# Patient Record
Sex: Female | Born: 1935 | Race: White | Hispanic: No | Marital: Married | Smoking: Never smoker
Health system: Southern US, Community
[De-identification: ages and names within clinical notes are randomized; demographics above are authoritative.]

## PROBLEM LIST (undated history)

## (undated) DIAGNOSIS — I4891 Unspecified atrial fibrillation: Secondary | ICD-10-CM

## (undated) DIAGNOSIS — D039 Melanoma in situ, unspecified: Secondary | ICD-10-CM

## (undated) DIAGNOSIS — C439 Malignant melanoma of skin, unspecified: Secondary | ICD-10-CM

## (undated) DIAGNOSIS — C801 Malignant (primary) neoplasm, unspecified: Secondary | ICD-10-CM

## (undated) HISTORY — PX: VULVECTOMY: SHX1086

## (undated) HISTORY — PX: CHOLECYSTECTOMY: SHX55

## (undated) HISTORY — PX: MELANOMA EXCISION: SHX5266

## (undated) HISTORY — DX: Melanoma in situ, unspecified: D03.9

## (undated) HISTORY — DX: Malignant melanoma of skin, unspecified: C43.9

---

## 2016-03-30 DIAGNOSIS — C4499 Other specified malignant neoplasm of skin, unspecified: Secondary | ICD-10-CM | POA: Insufficient documentation

## 2017-06-05 DIAGNOSIS — K582 Mixed irritable bowel syndrome: Secondary | ICD-10-CM | POA: Insufficient documentation

## 2017-06-05 DIAGNOSIS — F5104 Psychophysiologic insomnia: Secondary | ICD-10-CM | POA: Insufficient documentation

## 2017-06-20 DIAGNOSIS — I2510 Atherosclerotic heart disease of native coronary artery without angina pectoris: Secondary | ICD-10-CM | POA: Insufficient documentation

## 2017-07-04 DIAGNOSIS — I071 Rheumatic tricuspid insufficiency: Secondary | ICD-10-CM | POA: Insufficient documentation

## 2018-07-16 ENCOUNTER — Other Ambulatory Visit: Payer: Self-pay | Admitting: Internal Medicine

## 2018-07-16 DIAGNOSIS — Z1231 Encounter for screening mammogram for malignant neoplasm of breast: Secondary | ICD-10-CM

## 2018-08-13 ENCOUNTER — Ambulatory Visit
Admission: RE | Admit: 2018-08-13 | Discharge: 2018-08-13 | Disposition: A | Discharge status: Home / Self Care | Payer: Medicare Other | Source: Ambulatory Visit | Attending: Internal Medicine | Admitting: Internal Medicine

## 2018-08-13 ENCOUNTER — Encounter (HOSPITAL_COMMUNITY): Payer: Self-pay

## 2018-08-13 DIAGNOSIS — Z1231 Encounter for screening mammogram for malignant neoplasm of breast: Secondary | ICD-10-CM | POA: Diagnosis present

## 2018-08-13 HISTORY — DX: Malignant (primary) neoplasm, unspecified: C80.1

## 2018-08-21 ENCOUNTER — Other Ambulatory Visit: Payer: Self-pay | Admitting: Internal Medicine

## 2018-08-21 DIAGNOSIS — R928 Other abnormal and inconclusive findings on diagnostic imaging of breast: Secondary | ICD-10-CM

## 2018-08-30 ENCOUNTER — Ambulatory Visit
Admission: RE | Admit: 2018-08-30 | Discharge: 2018-08-30 | Disposition: A | Discharge status: Home / Self Care | Payer: Medicare Other | Source: Ambulatory Visit | Attending: Internal Medicine | Admitting: Internal Medicine

## 2018-08-30 DIAGNOSIS — R928 Other abnormal and inconclusive findings on diagnostic imaging of breast: Secondary | ICD-10-CM | POA: Diagnosis present

## 2019-01-25 ENCOUNTER — Emergency Department: Payer: Medicare Other

## 2019-01-25 ENCOUNTER — Encounter: Payer: Self-pay | Admitting: Emergency Medicine

## 2019-01-25 ENCOUNTER — Emergency Department
Admission: EM | Admit: 2019-01-25 | Discharge: 2019-01-25 | Disposition: A | Discharge status: Home / Self Care | Payer: Medicare Other | Attending: Emergency Medicine | Admitting: Emergency Medicine

## 2019-01-25 ENCOUNTER — Other Ambulatory Visit: Payer: Self-pay

## 2019-01-25 DIAGNOSIS — R002 Palpitations: Secondary | ICD-10-CM | POA: Diagnosis present

## 2019-01-25 DIAGNOSIS — Z79899 Other long term (current) drug therapy: Secondary | ICD-10-CM | POA: Insufficient documentation

## 2019-01-25 DIAGNOSIS — R7309 Other abnormal glucose: Secondary | ICD-10-CM

## 2019-01-25 DIAGNOSIS — E860 Dehydration: Secondary | ICD-10-CM | POA: Diagnosis not present

## 2019-01-25 HISTORY — DX: Unspecified atrial fibrillation: I48.91

## 2019-01-25 LAB — CBC WITH DIFFERENTIAL/PLATELET
Abs Immature Granulocytes: 0.02 10*3/uL (ref 0.00–0.07)
Basophils Absolute: 0.1 10*3/uL (ref 0.0–0.1)
Basophils Relative: 1 %
Eosinophils Absolute: 0.2 10*3/uL (ref 0.0–0.5)
Eosinophils Relative: 4 %
HCT: 40 % (ref 36.0–46.0)
Hemoglobin: 13.4 g/dL (ref 12.0–15.0)
Immature Granulocytes: 0 %
Lymphocytes Relative: 22 %
Lymphs Abs: 1.2 10*3/uL (ref 0.7–4.0)
MCH: 30.4 pg (ref 26.0–34.0)
MCHC: 33.5 g/dL (ref 30.0–36.0)
MCV: 90.7 fL (ref 80.0–100.0)
Monocytes Absolute: 0.4 10*3/uL (ref 0.1–1.0)
Monocytes Relative: 7 %
Neutro Abs: 3.5 10*3/uL (ref 1.7–7.7)
Neutrophils Relative %: 66 %
Platelets: 232 10*3/uL (ref 150–400)
RBC: 4.41 MIL/uL (ref 3.87–5.11)
RDW: 13.2 % (ref 11.5–15.5)
WBC: 5.3 10*3/uL (ref 4.0–10.5)
nRBC: 0 % (ref 0.0–0.2)

## 2019-01-25 LAB — BASIC METABOLIC PANEL
Anion gap: 11 (ref 5–15)
BUN: 19 mg/dL (ref 8–23)
CO2: 21 mmol/L — ABNORMAL LOW (ref 22–32)
Calcium: 9 mg/dL (ref 8.9–10.3)
Chloride: 108 mmol/L (ref 98–111)
Creatinine, Ser: 0.92 mg/dL (ref 0.44–1.00)
GFR calc Af Amer: >60 mL/min (ref >60)
GFR calc non Af Amer: 58 mL/min — ABNORMAL LOW (ref >60)
Glucose, Bld: 187 mg/dL — ABNORMAL HIGH (ref 70–99)
Potassium: 3.8 mmol/L (ref 3.5–5.1)
Sodium: 140 mmol/L (ref 135–145)

## 2019-01-25 LAB — GLUCOSE, CAPILLARY: Glucose-Capillary: 93 mg/dL (ref 70–99)

## 2019-01-25 LAB — TROPONIN I: Troponin I: <0.03 ng/mL (ref <0.03)

## 2019-01-25 LAB — MAGNESIUM: Magnesium: 2.4 mg/dL (ref 1.7–2.4)

## 2019-01-25 MED ORDER — SODIUM CHLORIDE 0.9 % IV BOLUS
500.0000 mL | Freq: Once | INTRAVENOUS | Status: AC
  Administered 2019-01-25: 09:00:00 500 mL via INTRAVENOUS

## 2019-01-25 NOTE — Discharge Instructions (Signed)
As explained to you your sugar was slightly elevated here.  Since this is not a fasting result it is important they follow-up with your primary care doctor within the next week to have repeat labs done.  Follow-up with your cardiologist in 2 days.  Return to the emergency room for chest pain, shortness of breath, or recurrence of her palpitations.

## 2019-01-25 NOTE — ED Triage Notes (Signed)
Pt to ED via POV c/o palpitations since last night. Pt states that she called her cardiologist who recommended that she come in and be evaluated. Pt is in NAD.

## 2019-01-25 NOTE — ED Notes (Signed)
Patient is speaking on the phone with her husband.  Will continue to monitor.

## 2019-01-25 NOTE — ED Provider Notes (Addendum)
[facility] Emergency Department Provider Note  ____________________________________________  Time seen: Approximately 8:47 AM  I have reviewed the triage vital signs and the nursing notes.   HISTORY  Chief Complaint Palpitations   HPI Laurie Shields is a 85 y.o. female with a history of atrial fibrillation on Eliquis, melanoma, CAD, hyperlipidemia who presents for evaluation of palpitations.  Patient reports that she occasionally has palpitations in her chest but those are usually short-lived.  She reports that last night she was unable to sleep because the palpitations were ongoing throughout the night.  She denies chest pain or shortness of breath, cough, fever, vomiting, diarrhea, abdominal pain, dysuria or hematuria.  This morning the palpitations were ongoing which prompted her to call her primary care doctor.  The doctor instructed her to come to the hospital for evaluation.  Patient voices compliance with her Eliquis.  No personal family history of blood clots, no leg pain or swelling.  Past Medical History:  Diagnosis Date  . Atrial fibrillation ([place])   . Cancer ([place])    melanoma    There are no active problems to display for this patient.   History reviewed. No pertinent surgical history.  Prior to Admission medications   Medication Sig Start Date End Date Taking? Authorizing Provider  Cholecalciferol (VITAMIN D3) 50 MCG (2000 UT) capsule Take 2,000 Units by mouth daily.   Yes [provider]  co-enzyme Q-10 30 MG capsule Take 30 mg by mouth daily.    Yes [provider]  diltiazem (DILT-XR) 240 MG 24 hr capsule TAKE 1 CAPSULE BY MOUTH  ONCE DAILY 01/21/19  Yes [provider]  ELIQUIS 2.5 MG TABS tablet Take 2.5 mg by mouth daily. 11/20/18  Yes [provider]  Multiple Vitamin (MULTIVITAMIN) tablet Take 1 tablet by mouth daily.   Yes [provider]  potassium chloride SA (KLOR-CON M20) 20 MEQ tablet  Take 20 mEq by mouth 2 (two) times daily. 05/23/17  Yes [provider]    Allergies Trazodone  Family History  Problem Relation Age of Onset  . Breast cancer Neg Hx     Social History Social History   Tobacco Use  . Smoking status: Never Smoker  . Smokeless tobacco: Never Used  Substance Use Topics  . Alcohol use: Not Currently  . Drug use: Not Currently    Review of Systems  Constitutional: Negative for fever. Eyes: Negative for visual changes. ENT: Negative for sore throat. Neck: No neck pain  Cardiovascular: Negative for chest pain. + palpitations Respiratory: Negative for shortness of breath. Gastrointestinal: Negative for abdominal pain, vomiting or diarrhea. Genitourinary: Negative for dysuria. Musculoskeletal: Negative for back pain. Skin: Negative for rash. Neurological: Negative for headaches, weakness or numbness. Psych: No SI or HI  ____________________________________________   PHYSICAL EXAM:  VITAL SIGNS: ED Triage Vitals  Enc Vitals Group     BP 01/25/19 0823 109/66     Pulse Rate 01/25/19 0823 99     Resp 01/25/19 0823 16     Temp 01/25/19 0823 (!) 97.5 F (36.4 C)     Temp Source 01/25/19 0823 Oral     SpO2 01/25/19 0823 97 %     Weight 01/25/19 0819 125 lb (56.7 kg)     Height 01/25/19 0819 5\' 4"  (1.626 m)     Head Circumference --      Peak Flow --      Pain Score 01/25/19 0819 0     Pain Loc --  Pain Edu? --      Excl. in [place]? --     Constitutional: Alert and oriented. Well appearing and in no apparent distress. HEENT:      Head: Normocephalic and atraumatic.         Eyes: Conjunctivae are normal. Sclera is non-icteric.       Mouth/Throat: Mucous membranes are moist.       Neck: Supple with no signs of meningismus. Cardiovascular: Regular rate and rhythm. No murmurs, gallops, or rubs. 2+ symmetrical distal pulses are present in all extremities. No JVD. Respiratory: Normal respiratory effort. Lungs are clear to  auscultation bilaterally. No wheezes, crackles, or rhonchi.  Gastrointestinal: Soft, non tender, and non distended with positive bowel sounds. No rebound or guarding. Musculoskeletal: Nontender with normal range of motion in all extremities. No edema, cyanosis, or erythema of extremities. Neurologic: Normal speech and language. Face is symmetric. Moving all extremities. No gross focal neurologic deficits are appreciated. Skin: Skin is warm, dry and intact. No rash noted. Psychiatric: Mood and affect are normal. Speech and behavior are normal.  ____________________________________________   LABS (all labs ordered are listed, but only abnormal results are displayed)  Labs Reviewed  BASIC METABOLIC PANEL - Abnormal; Notable for the following components:      Result Value   CO2 21 (*)    Glucose, Bld 187 (*)    GFR calc non Af Amer 58 (*)    All other components within normal limits  CBC WITH DIFFERENTIAL/PLATELET  MAGNESIUM  TROPONIN I   ____________________________________________  EKG  ED ECG REPORT I, Rudene Re, the attending physician, personally viewed and interpreted this ECG.  Sinus tachycardia, rate of 103, normal intervals, normal axis, low voltage QRS, anterior and inferior Q waves, no ST elevations or depressions.  No prior for comparison. ____________________________________________  RADIOLOGY  I have personally reviewed the images performed during this visit and I agree with the Radiologist's read.   Interpretation by Radiologist:  Dg Chest 2 View  Result Date: 01/25/2019 CLINICAL DATA:  Patient reports onset of rapid heart rate last night. Reports associated SOB that is worse when recumbent. Patient reports symptoms have improved since last night but not totally resolved. Denies CP or fever. Hx fever, afib, EXAM: CHEST - 2 VIEW COMPARISON:  none FINDINGS: Lungs are clear, hyperinflated. Heart size upper limits normal. Aortic Atherosclerosis (ICD10-170.0).  No effusion.  No pneumothorax. Fixation hardware in the proximal left humerus partially visualized. Surgical clips in the upper abdomen. IMPRESSION: No acute cardiopulmonary disease. Electronically Signed   By: Lucrezia Europe M.D.   On: 01/25/2019 09:26      ____________________________________________   PROCEDURES  Procedure(s) performed: None Procedures Critical Care performed:  None ____________________________________________   INITIAL IMPRESSION / ASSESSMENT AND PLAN / ED COURSE  85 y.o. female with a history of atrial fibrillation on Eliquis, melanoma, CAD, hyperlipidemia who presents for evaluation of palpitations. Ddx A. fib versus other possible dysrhythmia such as SVT versus V. tach.  At this time patient is in normal sinus rhythm and continues to endorse palpitations.  EKG showing no evidence of dysrhythmia or ischemia.  Will check electrolytes, will check for dehydration, will check for anemia, will check for cardiac ischemia.  Will monitor on telemetry.  Patient looks slightly dry will give IV fluids.    _________________________ 11:30 AM on 01/25/2019 ----------------------------------------- Labs showing no leukocytosis, no anemia, baseline creatinine, normal electrolytes.  Sugar slightly elevated at 187 however patient reports drinking an Ensure before coming in.  Recommended follow-up with PCP for recheck fasting blood glucose.  Troponin negative.  Chest x-ray with no acute findings.  Patient was monitored on telemetry for 3 hours with no arrhythmias seen.  After 500 cc bolus patient felt markedly improved with resolution of her palpitations.  Patient ambulated with no difficulty and had no chest pain, dizziness, shortness of breath with ambulation.  Recommended follow-up with cardiologist.  Discussed my standard return precautions.    As part of my medical decision making, I reviewed the following data within the [place] notes reviewed and  incorporated, Labs reviewed , EKG interpreted , Old chart reviewed, Radiograph reviewed , Notes from prior ED visits and [place] Controlled Substance Database    Pertinent labs & imaging results that were available during my care of the patient were reviewed by me and considered in my medical decision making (see chart for details).    ____________________________________________   FINAL CLINICAL IMPRESSION(S) / ED DIAGNOSES  Final diagnoses:  Palpitations  Dehydration  Elevated random blood glucose level      NEW MEDICATIONS STARTED DURING THIS VISIT:  ED Discharge Orders    None       Note:  This document was prepared using Dragon voice recognition software and may include unintentional dictation errors.    Alfred Levins, [place], MD 01/25/19 [place], [place], [place] 01/25/19 [phone]

## 2019-01-25 NOTE — ED Notes (Signed)
Returned from XR 

## 2019-02-12 DIAGNOSIS — I10 Essential (primary) hypertension: Secondary | ICD-10-CM | POA: Insufficient documentation

## 2019-03-11 DIAGNOSIS — C4492 Squamous cell carcinoma of skin, unspecified: Secondary | ICD-10-CM

## 2019-03-11 HISTORY — DX: Squamous cell carcinoma of skin, unspecified: C44.92

## 2019-06-29 ENCOUNTER — Emergency Department
Admission: EM | Admit: 2019-06-29 | Discharge: 2019-06-29 | Disposition: A | Discharge status: Home / Self Care | Payer: Medicare Other | Attending: Emergency Medicine | Admitting: Emergency Medicine

## 2019-06-29 ENCOUNTER — Emergency Department: Payer: Medicare Other

## 2019-06-29 ENCOUNTER — Other Ambulatory Visit: Payer: Self-pay

## 2019-06-29 DIAGNOSIS — I4891 Unspecified atrial fibrillation: Secondary | ICD-10-CM | POA: Diagnosis not present

## 2019-06-29 DIAGNOSIS — R109 Unspecified abdominal pain: Secondary | ICD-10-CM

## 2019-06-29 DIAGNOSIS — Z7901 Long term (current) use of anticoagulants: Secondary | ICD-10-CM | POA: Insufficient documentation

## 2019-06-29 DIAGNOSIS — R1031 Right lower quadrant pain: Secondary | ICD-10-CM | POA: Diagnosis not present

## 2019-06-29 LAB — CBC WITH DIFFERENTIAL/PLATELET
Abs Immature Granulocytes: 0.03 10*3/uL (ref 0.00–0.07)
Basophils Absolute: 0.1 10*3/uL (ref 0.0–0.1)
Basophils Relative: 1 %
Eosinophils Absolute: 0.2 10*3/uL (ref 0.0–0.5)
Eosinophils Relative: 3 %
HCT: 38.4 % (ref 36.0–46.0)
Hemoglobin: 12.7 g/dL (ref 12.0–15.0)
Immature Granulocytes: 0 %
Lymphocytes Relative: 27 %
Lymphs Abs: 1.8 10*3/uL (ref 0.7–4.0)
MCH: 30.5 pg (ref 26.0–34.0)
MCHC: 33.1 g/dL (ref 30.0–36.0)
MCV: 92.3 fL (ref 80.0–100.0)
Monocytes Absolute: 0.7 10*3/uL (ref 0.1–1.0)
Monocytes Relative: 11 %
Neutro Abs: 3.9 10*3/uL (ref 1.7–7.7)
Neutrophils Relative %: 58 %
Platelets: 200 10*3/uL (ref 150–400)
RBC: 4.16 MIL/uL (ref 3.87–5.11)
RDW: 13.1 % (ref 11.5–15.5)
WBC: 6.7 10*3/uL (ref 4.0–10.5)
nRBC: 0 % (ref 0.0–0.2)

## 2019-06-29 LAB — COMPREHENSIVE METABOLIC PANEL
ALT: 38 U/L (ref 0–44)
AST: 31 U/L (ref 15–41)
Albumin: 4.4 g/dL (ref 3.5–5.0)
Alkaline Phosphatase: 103 U/L (ref 38–126)
Anion gap: 13 (ref 5–15)
BUN: 21 mg/dL (ref 8–23)
CO2: 23 mmol/L (ref 22–32)
Calcium: 9.6 mg/dL (ref 8.9–10.3)
Chloride: 100 mmol/L (ref 98–111)
Creatinine, Ser: 0.84 mg/dL (ref 0.44–1.00)
GFR calc Af Amer: >60 mL/min (ref >60)
GFR calc non Af Amer: >60 mL/min (ref >60)
Glucose, Bld: 108 mg/dL — ABNORMAL HIGH (ref 70–99)
Potassium: 3.8 mmol/L (ref 3.5–5.1)
Sodium: 136 mmol/L (ref 135–145)
Total Bilirubin: 0.7 mg/dL (ref 0.3–1.2)
Total Protein: 7.2 g/dL (ref 6.5–8.1)

## 2019-06-29 LAB — URINALYSIS, ROUTINE W REFLEX MICROSCOPIC
Bilirubin Urine: NEGATIVE
Glucose, UA: NEGATIVE mg/dL
Hgb urine dipstick: NEGATIVE
Ketones, ur: NEGATIVE mg/dL
Nitrite: NEGATIVE
Protein, ur: NEGATIVE mg/dL
Specific Gravity, Urine: 1.002 — ABNORMAL LOW (ref 1.005–1.030)
Squamous Epithelial / HPF: NONE SEEN (ref 0–5)
pH: 7 (ref 5.0–8.0)

## 2019-06-29 NOTE — ED Notes (Signed)
Report received from [place], [place]. Pt is laying in bed comfortably with husband at bedside. Pt in NAD at this time. Pt has no complaints at this time. Delay explained to pt. ED stretcher locked and in lowest position. Pt given warm blankets. Will continue to monitor.

## 2019-06-29 NOTE — Discharge Instructions (Signed)
Your work-up was reassuring.  You should return to the ER for vomiting, fevers, pain your chest or any other concerns.  Negative for urinary tract calculi.   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

## 2019-06-29 NOTE — ED Notes (Signed)
Pt's spouse out to desk to ask how much longer. Spouse informed that pt's are taken to CT by acuity and in order. Spouse and pt verbalized understanding to this RN.

## 2019-06-29 NOTE — ED Provider Notes (Signed)
[facility] Emergency Department Provider Note  Time seen: 4:32 AM  I have reviewed the triage vital signs and the nursing notes.   HISTORY  Chief Complaint Flank Pain   HPI Laurie Shields is a 85 y.o. female with a past medical history of atrial fibrillation presents to the emergency department for right flank pain.  According to the patient beginning earlier today she developed acute onset of right flank pain initially started in her right mid back and then radiated around to her right flank.  States the pain worsened around 10 or 11 PM they came to the emergency department around midnight.  However since waiting here for several hours the pain has dissipated and she describes the pain is very minimal currently.  Denies any vomiting or diarrhea.  Denies any dysuria or hematuria.  No history of kidney stones.  Denies any fever cough or shortness of breath.  Past Medical History:  Diagnosis Date  . Atrial fibrillation ([place])   . Cancer ([place])    melanoma    There are no active problems to display for this patient.   No past surgical history on file.  Prior to Admission medications   Medication Sig Start Date End Date Taking? Authorizing Provider  Cholecalciferol (VITAMIN D3) 50 MCG (2000 UT) capsule Take 2,000 Units by mouth daily.    [provider]  co-enzyme Q-10 30 MG capsule Take 30 mg by mouth daily.     [provider]  diltiazem (DILT-XR) 240 MG 24 hr capsule TAKE 1 CAPSULE BY MOUTH  ONCE DAILY 01/21/19   [provider]  ELIQUIS 2.5 MG TABS tablet Take 2.5 mg by mouth daily. 11/20/18   [provider]  Multiple Vitamin (MULTIVITAMIN) tablet Take 1 tablet by mouth daily.    [provider]  potassium chloride SA (KLOR-CON M20) 20 MEQ tablet Take 20 mEq by mouth 2 (two) times daily. 05/23/17   [provider]    Allergies  Allergen Reactions  . Trazodone Other (See Comments) and Nausea Only    Blurred  vision    Family History  Problem Relation Age of Onset  . Breast cancer Neg Hx     Social History Social History   Tobacco Use  . Smoking status: Never Smoker  . Smokeless tobacco: Never Used  Substance Use Topics  . Alcohol use: Not Currently  . Drug use: Not Currently    Review of Systems Constitutional: Negative for fever. Cardiovascular: Negative for chest pain. Respiratory: Negative for shortness of breath. Gastrointestinal: Right flank pain. Genitourinary: Negative for urinary compaints Musculoskeletal: Negative for musculoskeletal complaints Skin: Negative for skin complaints  Neurological: Negative for headache All other ROS negative  ____________________________________________   PHYSICAL EXAM:  VITAL SIGNS: ED Triage Vitals  Enc Vitals Group     BP 06/29/19 0108 (!) 153/98     Pulse Rate 06/29/19 0108 74     Resp 06/29/19 0108 18     Temp 06/29/19 0108 97.7 F (36.5 C)     Temp Source 06/29/19 0108 Oral     SpO2 06/29/19 0108 97 %     Weight 06/29/19 0109 123 lb (55.8 kg)     Height 06/29/19 0109 5\' 3"  (1.6 m)     Head Circumference --      Peak Flow --      Pain Score 06/29/19 0109 8     Pain Loc --      Pain Edu? --  Excl. in [place]? --    Constitutional: Alert and oriented. Well appearing and in no distress. Eyes: Normal exam ENT      Head: Normocephalic and atraumatic.      Mouth/Throat: Mucous membranes are moist. Cardiovascular: Normal rate, regular rhythm.  Respiratory: Normal respiratory effort without tachypnea nor retractions. Breath sounds are clear Gastrointestinal: Soft and nontender. No distention.  There is no CVA tenderness. Musculoskeletal: Nontender with normal range of motion in all extremities Neurologic:  Normal speech and language. No gross focal neurologic deficits  Skin:  Skin is warm, dry and intact.  Psychiatric: Mood and affect are normal.  ____________________________________________    RADIOLOGY  CT  pending  ____________________________________________   INITIAL IMPRESSION / ASSESSMENT AND PLAN / ED COURSE  Pertinent labs & imaging results that were available during my care of the patient were reviewed by me and considered in my medical decision making (see chart for details).   Patient presents emergency department for right flank pain starting earlier today.  Pain has since dissipated considerably.  Patient's labs are largely within normal limits.  Differential would still include ureterolithiasis less likely appendicitis given benign abdominal exam.  Urinalysis does show rare bacteria but no white cells or red cells does not appear consistent with UTI we will add a urine culture as a precaution.  Patient is status post cholecystectomy many years ago per patient.  We will proceed with a CT renal scan to further evaluate.  CT scan pending.  UA pending.  Patient care signed out to oncoming physician.  Laurie Shields was evaluated in Emergency Department on 06/29/2019 for the symptoms described in the history of present illness. She was evaluated in the context of the global COVID-19 pandemic, which necessitated consideration that the patient might be at risk for infection with the SARS-CoV-2 virus that causes COVID-19. Institutional protocols and algorithms that pertain to the evaluation of patients at risk for COVID-19 are in a state of rapid change based on information released by regulatory bodies including the CDC and federal and state organizations. These policies and algorithms were followed during the patient's care in the ED.  ____________________________________________   FINAL CLINICAL IMPRESSION(S) / ED DIAGNOSES  Right flank pain   Harvest Dark, MD 07/03/19 2235

## 2019-06-29 NOTE — ED Triage Notes (Signed)
Patient reports pain to right flank area since early Saturday evening.  Denies urinary symptoms.

## 2019-06-29 NOTE — ED Notes (Signed)
Pt up to the BR with no assistance needed.

## 2019-06-29 NOTE — ED Provider Notes (Signed)
7:51 AM Assumed care for off going team.   Blood pressure (!) 154/85, pulse 65, temperature 97.7 F (36.5 C), temperature source Oral, resp. rate 17, height 5\' 3"  (1.6 m), weight 55.8 kg, SpO2 98 %.  See their HPI for full report but in brief R flank pain no prior kidney stone history. Resolved pain now. Labs normal. CT renal.   Labs reviewed and are reassuring.  No evidence of UTI.  CT scan without evidence of kidney stone, appendicitis.  Patient does have diverticulosis without signs of diverticulitis.  Reevaluated patient.  Pain is completely resolved at this time.  Patient's initial pain was in the right flank region.  She denies any chest pain.  I discussed getting an EKG to make sure that her heart looked okay but patient declined stating that her pain is now resolved and that she would just prefer to go home.  Given she is had no pain in her chest or shortness of breath with this is reasonable.  Patient understands that if she develops worsening or new pain that she should return to the ER for further work-up.   I discussed the provisional nature of ED diagnosis, the treatment so far, the ongoing plan of care, follow up appointments and return precautions with the patient and any family or support people present. They expressed understanding and agreed with the plan, discharged home.           Vanessa Golden, MD 06/29/19 [phone]

## 2019-06-30 LAB — URINE CULTURE: Culture: <10000 — AB

## 2019-07-22 DIAGNOSIS — G4733 Obstructive sleep apnea (adult) (pediatric): Secondary | ICD-10-CM

## 2019-07-27 IMAGING — US ULTRASOUND LEFT BREAST LIMITED
1 series · 2 of 2 positions shown · non-contrast
Comparison: Previous exam(s).

CLINICAL DATA: Possible left breast distortion seen on most recent
screening mammography.

EXAM:
DIGITAL DIAGNOSTIC LEFT MAMMOGRAM WITH CAD AND TOMO
ULTRASOUND LEFT BREAST

[Series 1: ultrasound left breast limited · 0.05mm/px · 2 of 2 slices shown]
[im 1/2]
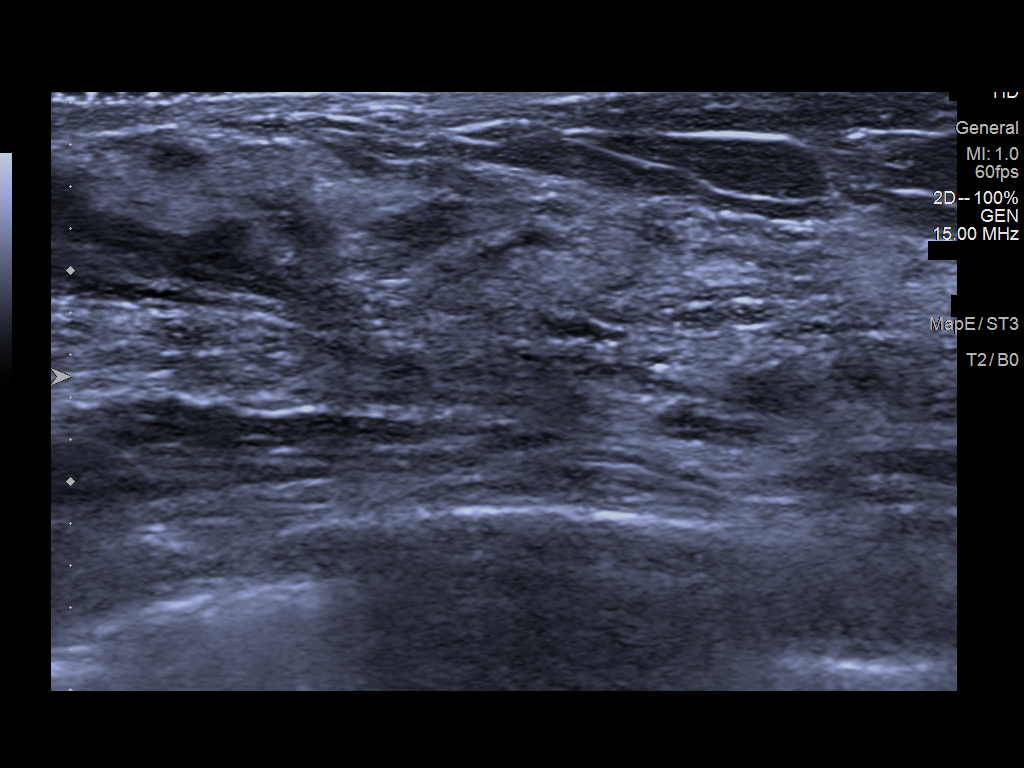
[im 2/2]
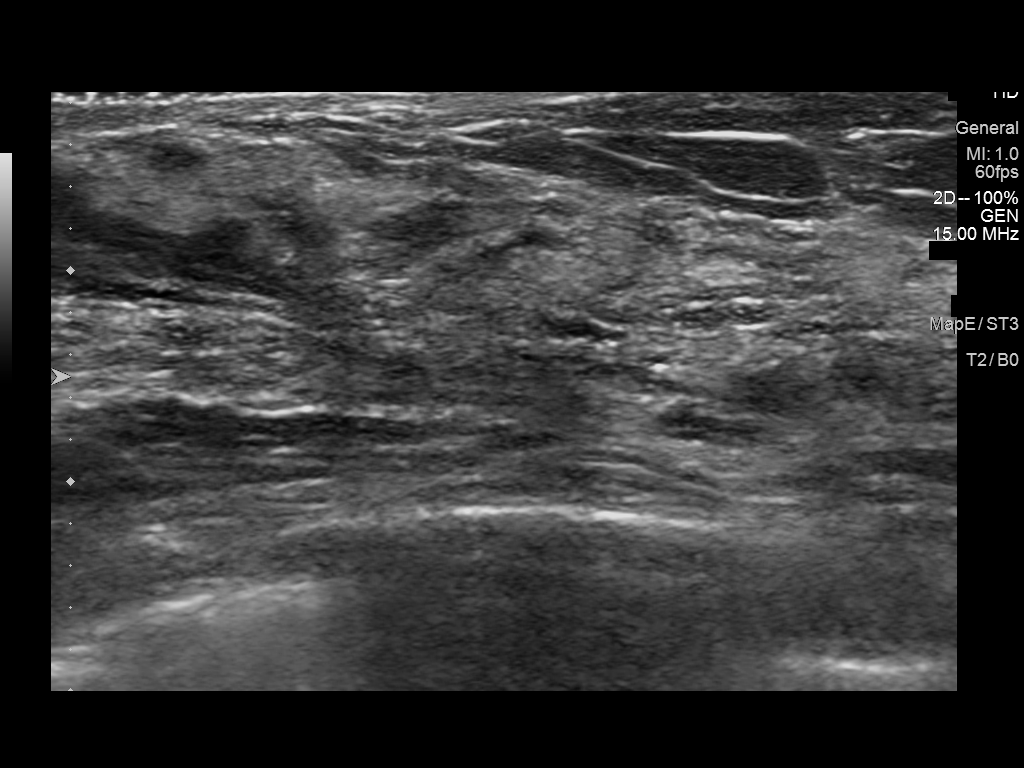

[2 of 2 positions shown; findings below may reference images not displayed]

ACR Breast Density Category c: The breast tissue is heterogeneously
dense, which may obscure small masses.
FINDINGS: Additional mammographic views of the left breast demonstrate no
suspicious masses or areas of architectural distortion. The
previously questioned distortion in the slightly lateral left
breast, anterior depth, effaces to glandular tissue.

Mammographic images were processed with CAD.

On physical exam, no suspicious masses are palpated.

Confirmatory targeted left breast ultrasound is performed, showing
no suspicious masses or shadowing lesions.
IMPRESSION: No mammographic or sonographic evidence of malignancy in the left
breast.

RECOMMENDATION:
Screening mammogram in one year.(Code:[id number])

I have discussed the findings and recommendations with the patient.
Results were also provided in writing at the conclusion of the
visit. If applicable, a reminder letter will be sent to the patient
regarding the next appointment.

BI-RADS CATEGORY  2: Benign.

## 2019-07-28 ENCOUNTER — Encounter: Payer: Self-pay | Admitting: *Deleted

## 2019-07-28 ENCOUNTER — Ambulatory Visit
Admission: RE | Admit: 2019-07-28 | Discharge: 2019-07-28 | Disposition: A | Discharge status: Home / Self Care | Payer: Medicare Other | Source: Ambulatory Visit | Attending: Infectious Diseases | Admitting: Infectious Diseases

## 2019-07-28 ENCOUNTER — Other Ambulatory Visit: Payer: Self-pay | Admitting: Infectious Diseases

## 2019-07-28 ENCOUNTER — Other Ambulatory Visit: Payer: Self-pay

## 2019-07-28 ENCOUNTER — Emergency Department: Payer: Medicare Other

## 2019-07-28 ENCOUNTER — Emergency Department
Admission: EM | Admit: 2019-07-28 | Discharge: 2019-07-28 | Disposition: A | Discharge status: Home / Self Care | Payer: Medicare Other | Attending: Emergency Medicine | Admitting: Emergency Medicine

## 2019-07-28 DIAGNOSIS — I480 Paroxysmal atrial fibrillation: Secondary | ICD-10-CM

## 2019-07-28 DIAGNOSIS — R5383 Other fatigue: Secondary | ICD-10-CM

## 2019-07-28 DIAGNOSIS — R42 Dizziness and giddiness: Secondary | ICD-10-CM

## 2019-07-28 DIAGNOSIS — Z85820 Personal history of malignant melanoma of skin: Secondary | ICD-10-CM | POA: Insufficient documentation

## 2019-07-28 DIAGNOSIS — R531 Weakness: Secondary | ICD-10-CM | POA: Diagnosis present

## 2019-07-28 LAB — DIFFERENTIAL
Abs Immature Granulocytes: 0.02 10*3/uL (ref 0.00–0.07)
Basophils Absolute: 0.1 10*3/uL (ref 0.0–0.1)
Basophils Relative: 1 %
Eosinophils Absolute: 0.2 10*3/uL (ref 0.0–0.5)
Eosinophils Relative: 2 %
Immature Granulocytes: 0 %
Lymphocytes Relative: 25 %
Lymphs Abs: 1.8 10*3/uL (ref 0.7–4.0)
Monocytes Absolute: 0.6 10*3/uL (ref 0.1–1.0)
Monocytes Relative: 8 %
Neutro Abs: 4.6 10*3/uL (ref 1.7–7.7)
Neutrophils Relative %: 64 %

## 2019-07-28 LAB — COMPREHENSIVE METABOLIC PANEL
ALT: 22 U/L (ref 0–44)
AST: 23 U/L (ref 15–41)
Albumin: 4.5 g/dL (ref 3.5–5.0)
Alkaline Phosphatase: 95 U/L (ref 38–126)
Anion gap: 12 (ref 5–15)
BUN: 24 mg/dL — ABNORMAL HIGH (ref 8–23)
CO2: 23 mmol/L (ref 22–32)
Calcium: 9.9 mg/dL (ref 8.9–10.3)
Chloride: 104 mmol/L (ref 98–111)
Creatinine, Ser: 1.02 mg/dL — ABNORMAL HIGH (ref 0.44–1.00)
GFR calc Af Amer: 58 mL/min — ABNORMAL LOW (ref >60)
GFR calc non Af Amer: 50 mL/min — ABNORMAL LOW (ref >60)
Glucose, Bld: 93 mg/dL (ref 70–99)
Potassium: 3.9 mmol/L (ref 3.5–5.1)
Sodium: 139 mmol/L (ref 135–145)
Total Bilirubin: 0.8 mg/dL (ref 0.3–1.2)
Total Protein: 7.4 g/dL (ref 6.5–8.1)

## 2019-07-28 LAB — CBC
HCT: 39.5 % (ref 36.0–46.0)
Hemoglobin: 13.8 g/dL (ref 12.0–15.0)
MCH: 31.3 pg (ref 26.0–34.0)
MCHC: 34.9 g/dL (ref 30.0–36.0)
MCV: 89.6 fL (ref 80.0–100.0)
Platelets: 246 10*3/uL (ref 150–400)
RBC: 4.41 MIL/uL (ref 3.87–5.11)
RDW: 12.9 % (ref 11.5–15.5)
WBC: 7.2 10*3/uL (ref 4.0–10.5)
nRBC: 0 % (ref 0.0–0.2)

## 2019-07-28 LAB — APTT: aPTT: 35 seconds (ref 24–36)

## 2019-07-28 LAB — PROTIME-INR
INR: 1 (ref 0.8–1.2)
Prothrombin Time: 12.9 seconds (ref 11.4–15.2)

## 2019-07-28 LAB — TROPONIN I (HIGH SENSITIVITY): Troponin I (High Sensitivity): 5 ng/L (ref <18)

## 2019-07-28 MED ORDER — SODIUM CHLORIDE 0.9 % IV BOLUS
1000.0000 mL | Freq: Once | INTRAVENOUS | Status: AC
  Administered 2019-07-28: 21:00:00 1000 mL via INTRAVENOUS

## 2019-07-28 MED ORDER — IOHEXOL 350 MG/ML SOLN
75.0000 mL | Freq: Once | INTRAVENOUS | Status: AC | PRN
  Administered 2019-07-28: 75 mL via INTRAVENOUS

## 2019-07-28 NOTE — ED Provider Notes (Signed)
[facility] Emergency Department Provider Note  ____________________________________________   First MD Initiated Contact with Patient 07/28/19 1950     (approximate)  I have reviewed the triage vital signs and the nursing notes.   HISTORY  Chief Complaint Transient Ischemic Attack    HPI Laurie Shields is a 85 y.o. female  Here with transient weakness. Pt states that last night, she experienced an episode in which she began to feel acutely severely fatigued. She was in the bathroom at the time and felt like she could not move. She states she may have felt dizzy at the time but that it was more so due to fatigue than true vertigo. Since then, she has felt somewhat weak and tremulous. Denies any dysarthria, dysphagia, or focal numbness or weakness. She has had a mild intermittent headache as well. No recent med changes. No fever, chills. She called her PCP and had an outpatient CT today, which showed possible occlusion of MCA. Sent here for evaluation. She states she currently actually feels "fine." She has a h/o afib and feels strongly that her heart rate was normal and not in AFib during the episode. She had no CP or SOB.        Past Medical History:  Diagnosis Date   Atrial fibrillation ([place])    Cancer ([place])    melanoma    There are no active problems to display for this patient.   History reviewed. No pertinent surgical history.  Prior to Admission medications   Medication Sig Start Date End Date Taking? Authorizing Provider  Cholecalciferol (VITAMIN D3) 50 MCG (2000 UT) capsule Take 2,000 Units by mouth daily.   Yes [provider]  co-enzyme Q-10 30 MG capsule Take 30 mg by mouth daily.    Yes [provider]  diltiazem (DILT-XR) 240 MG 24 hr capsule Take 240 mg by mouth daily.  01/21/19  Yes [provider]  ELIQUIS 2.5 MG TABS tablet Take 2.5 mg by mouth daily. 11/20/18  Yes [provider]  meclizine  (ANTIVERT) 12.5 MG tablet Take 12.5 mg by mouth 3 (three) times daily. 07/28/19 08/07/19 Yes [provider]  metoprolol tartrate (LOPRESSOR) 25 MG tablet Take 25 mg by mouth daily. 06/12/19  Yes [provider]  Multiple Vitamin (MULTIVITAMIN) tablet Take 1 tablet by mouth daily.   Yes [provider]  omeprazole (PRILOSEC) 40 MG capsule Take 40 mg by mouth daily. 07/21/19  Yes [provider]  potassium chloride SA (KLOR-CON M20) 20 MEQ tablet Take 20 mEq by mouth 2 (two) times daily. 05/23/17  Yes [provider]    Allergies Trazodone  Family History  Problem Relation Age of Onset   Breast cancer Neg Hx     Social History Social History   Tobacco Use   Smoking status: Never Smoker   Smokeless tobacco: Never Used  Substance Use Topics   Alcohol use: Not Currently   Drug use: Not Currently    Review of Systems  Review of Systems  Constitutional: Positive for fatigue. Negative for fever.  HENT: Negative for congestion and sore throat.   Eyes: Negative for visual disturbance.  Respiratory: Negative for cough and shortness of breath.   Cardiovascular: Negative for chest pain.  Gastrointestinal: Negative for abdominal pain, diarrhea, nausea and vomiting.  Genitourinary: Negative for flank pain.  Musculoskeletal: Negative for back pain and neck pain.  Skin: Negative for rash and wound.  Neurological: Positive for light-headedness. Negative for weakness.  All  other systems reviewed and are negative.    ____________________________________________  PHYSICAL EXAM:      VITAL SIGNS: ED Triage Vitals  Enc Vitals Group     BP 07/28/19 1820 (!) 161/83     Pulse Rate 07/28/19 1820 76     Resp 07/28/19 1820 16     Temp 07/28/19 1820 98.6 F (37 C)     Temp Source 07/28/19 1820 Oral     SpO2 07/28/19 1820 99 %     Weight 07/28/19 1821 125 lb (56.7 kg)     Height 07/28/19 1821 5\' 4"  (1.626 m)     Head Circumference --       Peak Flow --      Pain Score 07/28/19 1830 3     Pain Loc --      Pain Edu? --      Excl. in [place]? --      Physical Exam Vitals signs and nursing note reviewed.  Constitutional:      General: She is not in acute distress.    Appearance: She is well-developed.  HENT:     Head: Normocephalic and atraumatic.  Eyes:     Conjunctiva/sclera: Conjunctivae normal.  Neck:     Musculoskeletal: Neck supple.  Cardiovascular:     Rate and Rhythm: Normal rate and regular rhythm.     Heart sounds: Normal heart sounds. No murmur. No friction rub.  Pulmonary:     Effort: Pulmonary effort is normal. No respiratory distress.     Breath sounds: Normal breath sounds. No wheezing or rales.  Abdominal:     General: There is no distension.     Palpations: Abdomen is soft.     Tenderness: There is no abdominal tenderness.  Skin:    General: Skin is warm.     Capillary Refill: Capillary refill takes less than 2 seconds.  Neurological:     Mental Status: She is alert and oriented to person, place, and time.     Motor: No abnormal muscle tone.     Comments: Neurological Exam:  Mental Status: Alert and oriented to person, place, and time. Attention and concentration normal. Speech clear. Recent memory is intact. Cranial Nerves: Visual fields grossly intact. EOMI and PERRLA. No nystagmus noted. Facial sensation intact at forehead, maxillary cheek, and chin/mandible bilaterally. No facial asymmetry or weakness. Hearing grossly normal. Uvula is midline, and palate elevates symmetrically. Normal SCM and trapezius strength. Tongue midline without fasciculations. Motor: Muscle strength 5/5 in proximal and distal UE and LE bilaterally. No pronator drift. Muscle tone normal. Sensation: Intact to light touch in upper and lower extremities distally bilaterally.  Gait: Normal without ataxia. Coordination: Normal FTN bilaterally.          ____________________________________________   LABS (all labs ordered  are listed, but only abnormal results are displayed)  Labs Reviewed  COMPREHENSIVE METABOLIC PANEL - Abnormal; Notable for the following components:      Result Value   BUN 24 (*)    Creatinine, Ser 1.02 (*)    GFR calc non Af Amer 50 (*)    GFR calc Af Amer 58 (*)    All other components within normal limits  PROTIME-INR  APTT  CBC  DIFFERENTIAL  TROPONIN I (HIGH SENSITIVITY)    ____________________________________________  EKG: Normal sinus rhythm, VR 71. PR 156, QRS 72, QTc 447. No acute ST elevations or depressions. ________________________________________  RADIOLOGY All imaging, including plain films, CT scans, and ultrasounds, independently reviewed by me,  and interpretations confirmed via formal radiology reads.  ED MD interpretation:   CT Angio Head/Neck: Normal  Official radiology report(s): Ct Angio Head W Or Wo Contrast  Result Date: 07/28/2019 CLINICAL DATA:  Dizziness and nausea EXAM: CT ANGIOGRAPHY HEAD AND NECK TECHNIQUE: Multidetector CT imaging of the head and neck was performed using the standard protocol during bolus administration of intravenous contrast. Multiplanar CT image reconstructions and MIPs were obtained to evaluate the vascular anatomy. Carotid stenosis measurements (when applicable) are obtained utilizing NASCET criteria, using the distal internal carotid diameter as the denominator. CONTRAST:  100mL OMNIPAQUE IOHEXOL 350 MG/ML SOLN COMPARISON:  Head CT 07/28/2019 FINDINGS: CT HEAD FINDINGS Brain: There is no mass, hemorrhage or extra-axial collection. The size and configuration of the ventricles and extra-axial CSF spaces are normal. There is no acute or chronic infarction. The brain parenchyma is normal. Skull: The visualized skull base, calvarium and extracranial soft tissues are normal. Sinuses/Orbits: No fluid levels or advanced mucosal thickening of the visualized paranasal sinuses. No mastoid or middle ear effusion. The orbits are normal. CTA NECK  FINDINGS SKELETON: There is no bony spinal canal stenosis. No lytic or blastic lesion. OTHER NECK: Normal pharynx, larynx and major salivary glands. No cervical lymphadenopathy. Unremarkable thyroid gland. UPPER CHEST: No pneumothorax or pleural effusion. No nodules or masses. AORTIC ARCH: There is no calcific atherosclerosis of the aortic arch. There is no aneurysm, dissection or hemodynamically significant stenosis of the visualized portion of the aorta. Conventional 3 vessel aortic branching pattern. The visualized proximal subclavian arteries are widely patent. RIGHT CAROTID SYSTEM: Normal without aneurysm, dissection or stenosis. LEFT CAROTID SYSTEM: Normal without aneurysm, dissection or stenosis. VERTEBRAL ARTERIES: Left dominant configuration. Both origins are clearly patent. There is no dissection, occlusion or flow-limiting stenosis to the skull base (V1-V3 segments). CTA HEAD FINDINGS POSTERIOR CIRCULATION: --Vertebral arteries: Normal V4 segments. --Posterior inferior cerebellar arteries (PICA): Patent origins from the vertebral arteries. --Anterior inferior cerebellar arteries (AICA): Patent origins from the basilar artery. --Basilar artery: Normal. --Superior cerebellar arteries: Normal. --Posterior cerebral arteries: Normal. Both originate from the basilar artery. Posterior communicating arteries (p-comm) are diminutive or absent. ANTERIOR CIRCULATION: --Intracranial internal carotid arteries: Atherosclerotic calcification of the internal carotid arteries at the skull base without hemodynamically significant stenosis. --Anterior cerebral arteries (ACA): Normal. Both A1 segments are present. Patent anterior communicating artery (a-comm). --Middle cerebral arteries (MCA): Normal. VENOUS SINUSES: As permitted by contrast timing, patent. ANATOMIC VARIANTS: None Review of the MIP images confirms the above findings. IMPRESSION: Normal CTA of the head and neck. Electronically Signed   By: Ulyses Jarred M.D.    On: 07/28/2019 21:17   Ct Head Wo Contrast  Addendum Date: 07/28/2019   ADDENDUM REPORT: 07/28/2019 18:07 ADDENDUM: These results were called by telephone at the time of interpretation on 07/28/2019 at 6:05 pm to provider Ouida Sills, who verbally acknowledged these results. Electronically Signed   By: Anner Crete M.D.   On: 07/28/2019 18:07   Result Date: 07/28/2019 CLINICAL DATA:  85 year old female with dizziness. EXAM: CT HEAD WITHOUT CONTRAST TECHNIQUE: Contiguous axial images were obtained from the base of the skull through the vertex without intravenous contrast. COMPARISON:  None. FINDINGS: Brain: Mild age-related atrophy and chronic microvascular ischemic changes. 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 There is no acute intracranial hemorrhage. No mass effect or midline shift. No extra-axial fluid collection. Vascular: There is high attenuation of the cerebral vasculature, likely related to hemoconcentration or dehydration. Focal area of higher density in the left MCA (  series 3 image 7 and coronal series 4, image 26) may represent partial vascular calcification although a clot is not excluded. CT angiography may provide better evaluation if there is concern for acute infarct. Skull: Normal. Negative for fracture or focal lesion. Sinuses/Orbits: No acute finding. Other: None IMPRESSION: 1. No acute intracranial hemorrhage. 2. Age-related atrophy and chronic microvascular ischemic changes. 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 3. High attenuation focus in the left MCA. CT angiography may provide better evaluation if there is clinical concern for acute infarct. Electronically Signed: By: Anner Crete M.D. On: 07/28/2019 17:41   Ct Angio Neck W And/or Wo Contrast  Result Date: 07/28/2019 CLINICAL DATA:  Dizziness and nausea EXAM: CT ANGIOGRAPHY HEAD AND NECK TECHNIQUE: Multidetector CT imaging of the head and neck was performed using the standard protocol during  bolus administration of intravenous contrast. Multiplanar CT image reconstructions and MIPs were obtained to evaluate the vascular anatomy. Carotid stenosis measurements (when applicable) are obtained utilizing NASCET criteria, using the distal internal carotid diameter as the denominator. CONTRAST:  82mL OMNIPAQUE IOHEXOL 350 MG/ML SOLN COMPARISON:  Head CT 07/28/2019 FINDINGS: CT HEAD FINDINGS Brain: There is no mass, hemorrhage or extra-axial collection. The size and configuration of the ventricles and extra-axial CSF spaces are normal. There is no acute or chronic infarction. The brain parenchyma is normal. Skull: The visualized skull base, calvarium and extracranial soft tissues are normal. Sinuses/Orbits: No fluid levels or advanced mucosal thickening of the visualized paranasal sinuses. No mastoid or middle ear effusion. The orbits are normal. CTA NECK FINDINGS SKELETON: There is no bony spinal canal stenosis. No lytic or blastic lesion. OTHER NECK: Normal pharynx, larynx and major salivary glands. No cervical lymphadenopathy. Unremarkable thyroid gland. UPPER CHEST: No pneumothorax or pleural effusion. No nodules or masses. AORTIC ARCH: There is no calcific atherosclerosis of the aortic arch. There is no aneurysm, dissection or hemodynamically significant stenosis of the visualized portion of the aorta. Conventional 3 vessel aortic branching pattern. The visualized proximal subclavian arteries are widely patent. RIGHT CAROTID SYSTEM: Normal without aneurysm, dissection or stenosis. LEFT CAROTID SYSTEM: Normal without aneurysm, dissection or stenosis. VERTEBRAL ARTERIES: Left dominant configuration. Both origins are clearly patent. There is no dissection, occlusion or flow-limiting stenosis to the skull base (V1-V3 segments). CTA HEAD FINDINGS POSTERIOR CIRCULATION: --Vertebral arteries: Normal V4 segments. --Posterior inferior cerebellar arteries (PICA): Patent origins from the vertebral arteries. --Anterior  inferior cerebellar arteries (AICA): Patent origins from the basilar artery. --Basilar artery: Normal. --Superior cerebellar arteries: Normal. --Posterior cerebral arteries: Normal. Both originate from the basilar artery. Posterior communicating arteries (p-comm) are diminutive or absent. ANTERIOR CIRCULATION: --Intracranial internal carotid arteries: Atherosclerotic calcification of the internal carotid arteries at the skull base without hemodynamically significant stenosis. --Anterior cerebral arteries (ACA): Normal. Both A1 segments are present. Patent anterior communicating artery (a-comm). --Middle cerebral arteries (MCA): Normal. VENOUS SINUSES: As permitted by contrast timing, patent. ANATOMIC VARIANTS: None Review of the MIP images confirms the above findings. IMPRESSION: Normal CTA of the head and neck. Electronically Signed   By: Ulyses Jarred M.D.   On: 07/28/2019 21:17    ____________________________________________  PROCEDURES   Procedure(s) performed (including Critical Care):  Procedures  ____________________________________________  INITIAL IMPRESSION / MDM / [place] / ED COURSE  As part of my medical decision making, I reviewed the following data within the [facility] notes reviewed and incorporated, Old chart reviewed, Notes from prior ED visits, and [place] Controlled Substance Database       *  Laurie Shields was evaluated in Emergency Department on 07/29/2019 for the symptoms described in the history of present illness. She was evaluated in the context of the global COVID-19 pandemic, which necessitated consideration that the patient might be at risk for infection with the SARS-CoV-2 virus that causes COVID-19. Institutional protocols and algorithms that pertain to the evaluation of patients at risk for COVID-19 are in a state of rapid change based on information released by regulatory bodies including the CDC and federal and state organizations.  These policies and algorithms were followed during the patient's care in the ED.  Some ED evaluations and interventions may be delayed as a result of limited staffing during the pandemic.*     Medical Decision Making:  85 yo F here with transient episode of fatigue, dizziness. Reviewed CT head from outside facility. She has no focal numbness, weakness, or other neurological deficits and CT Angio performed here shows normal CTA, with no evidence of high grade stenosis or occlusion. No carotid stenosis. Otherwise, pt's labs are very reassuring with no anemia or metabolic disturbance. She feels back to her baseline here.   While the etiology of her transient weakness is unclear, no apparent ongoing emergent pathology. While her sx do not necessarily sound stroke-like, I discussed that posterior TIA would be a consideration and given her AFib, offered admission for observation. Pt understands that she is already on anticoagulation, and would prefer that she follows up as an outpatient. Given that she is otherwise medically optimized, in NSR here, with good med adherence, no signs of high grade carotid stenosis, I think this is reasonable. Otherwise, no apparent medical explanation for her weakness. No signs of infection, anemia. No arrhythmia.  Will have her call her PCP to arrange further follow-up, and return should she have any recurrence of sx.  ____________________________________________  FINAL CLINICAL IMPRESSION(S) / ED DIAGNOSES  Final diagnoses:  Dizziness  Weakness     MEDICATIONS GIVEN DURING THIS VISIT:  Medications  sodium chloride 0.9 % bolus 1,000 mL (0 mLs Intravenous Stopped 07/28/19 2258)  iohexol (OMNIPAQUE) 350 MG/ML injection 75 mL (75 mLs Intravenous Contrast Given 07/28/19 2045)     ED Discharge Orders    None       Note:  This document was prepared using Dragon voice recognition software and may include unintentional dictation errors.   Duffy Bruce,  MD 07/29/19 [phone]

## 2019-07-28 NOTE — ED Notes (Signed)
Pt brought over from outpatient CT with concerns for stroke, states she has had dizziness and feeling off balance with nausea since yesterday.

## 2019-07-28 NOTE — ED Notes (Signed)
Pt assisted to the bathroom by this RN at this time. Pt tolerated well. Will continue to monitor for further patient needs. Pt continues to get fluids at this time.

## 2019-07-28 NOTE — Discharge Instructions (Addendum)
As we discussed, call your doctor in the morning to set up follow-up  Take your medications as prescribed  Drink plenty of fluids

## 2019-07-28 NOTE — ED Triage Notes (Signed)
Pt to ED reporting a CT performed today ordered by PCP after pt reported her symptoms. CT advised a CT angio if acute infarct suspected. Otherwise, no acute intracranial hemorrhage but potential chronic microvascular ischemic changes. 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   Pt woke at 4:00 in the morning feeling dizzy, nausea, headache and full body aching with the inability to focus. PT continues to fee fatigued upon arrival with a slight tremor.

## 2019-09-01 DIAGNOSIS — I63412 Cerebral infarction due to embolism of left middle cerebral artery: Secondary | ICD-10-CM | POA: Insufficient documentation

## 2019-09-08 ENCOUNTER — Other Ambulatory Visit: Payer: Self-pay | Admitting: Internal Medicine

## 2019-09-08 DIAGNOSIS — Z1231 Encounter for screening mammogram for malignant neoplasm of breast: Secondary | ICD-10-CM

## 2019-09-24 ENCOUNTER — Ambulatory Visit
Admission: RE | Admit: 2019-09-24 | Discharge: 2019-09-24 | Disposition: A | Discharge status: Home / Self Care | Payer: Medicare Other | Source: Ambulatory Visit | Attending: Internal Medicine | Admitting: Internal Medicine

## 2019-09-24 DIAGNOSIS — Z1231 Encounter for screening mammogram for malignant neoplasm of breast: Secondary | ICD-10-CM | POA: Insufficient documentation

## 2019-10-20 DIAGNOSIS — D692 Other nonthrombocytopenic purpura: Secondary | ICD-10-CM | POA: Insufficient documentation

## 2019-12-22 IMAGING — CR CHEST - 2 VIEW
2 series · 2 of 2 positions shown · non-contrast
Comparison: none

CLINICAL DATA: Patient reports onset of rapid heart rate last
reports symptoms have improved since last night but not totally
resolved. Denies CP or fever. Hx fever, afib,

EXAM:
CHEST - 2 VIEW

[chest pa]
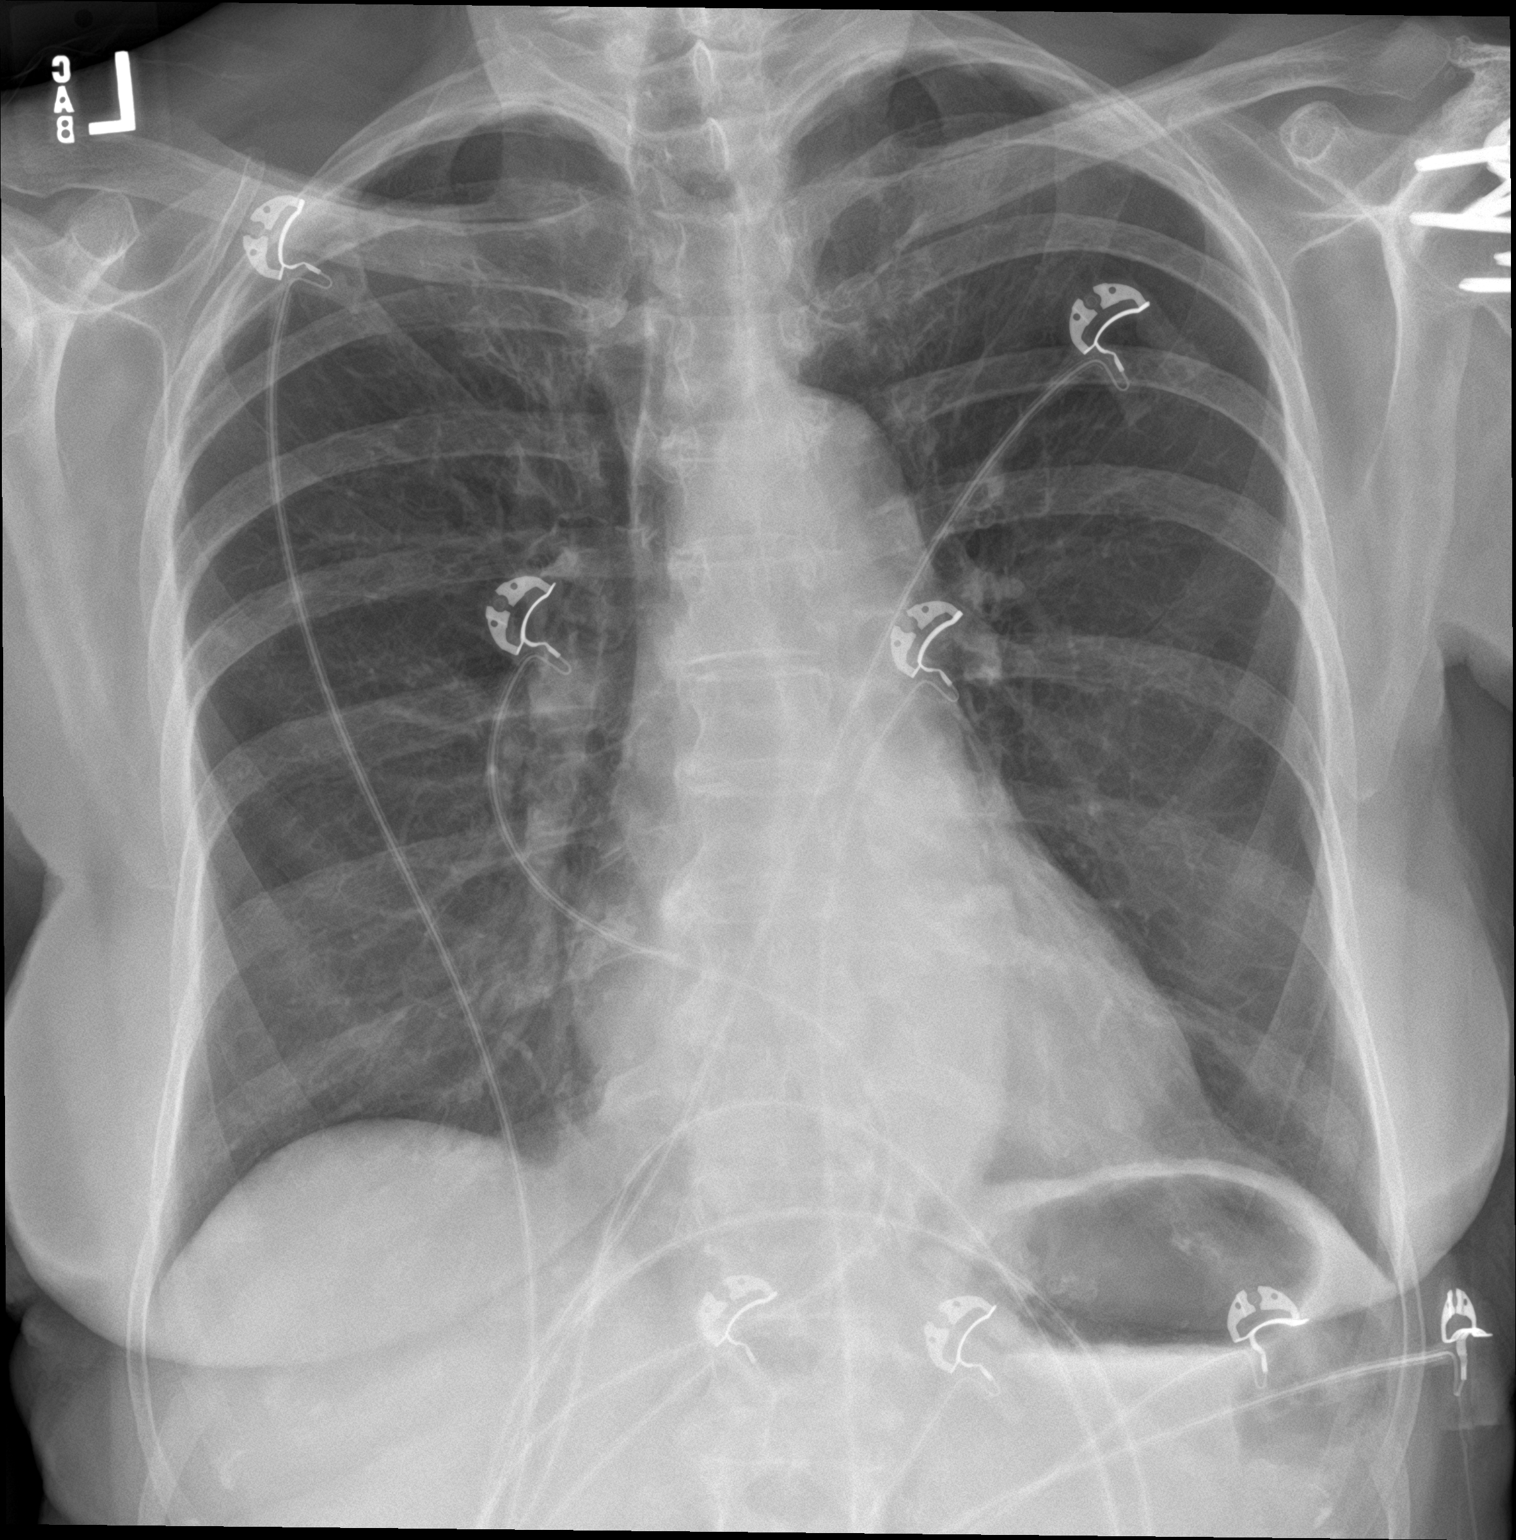

[chest lat]
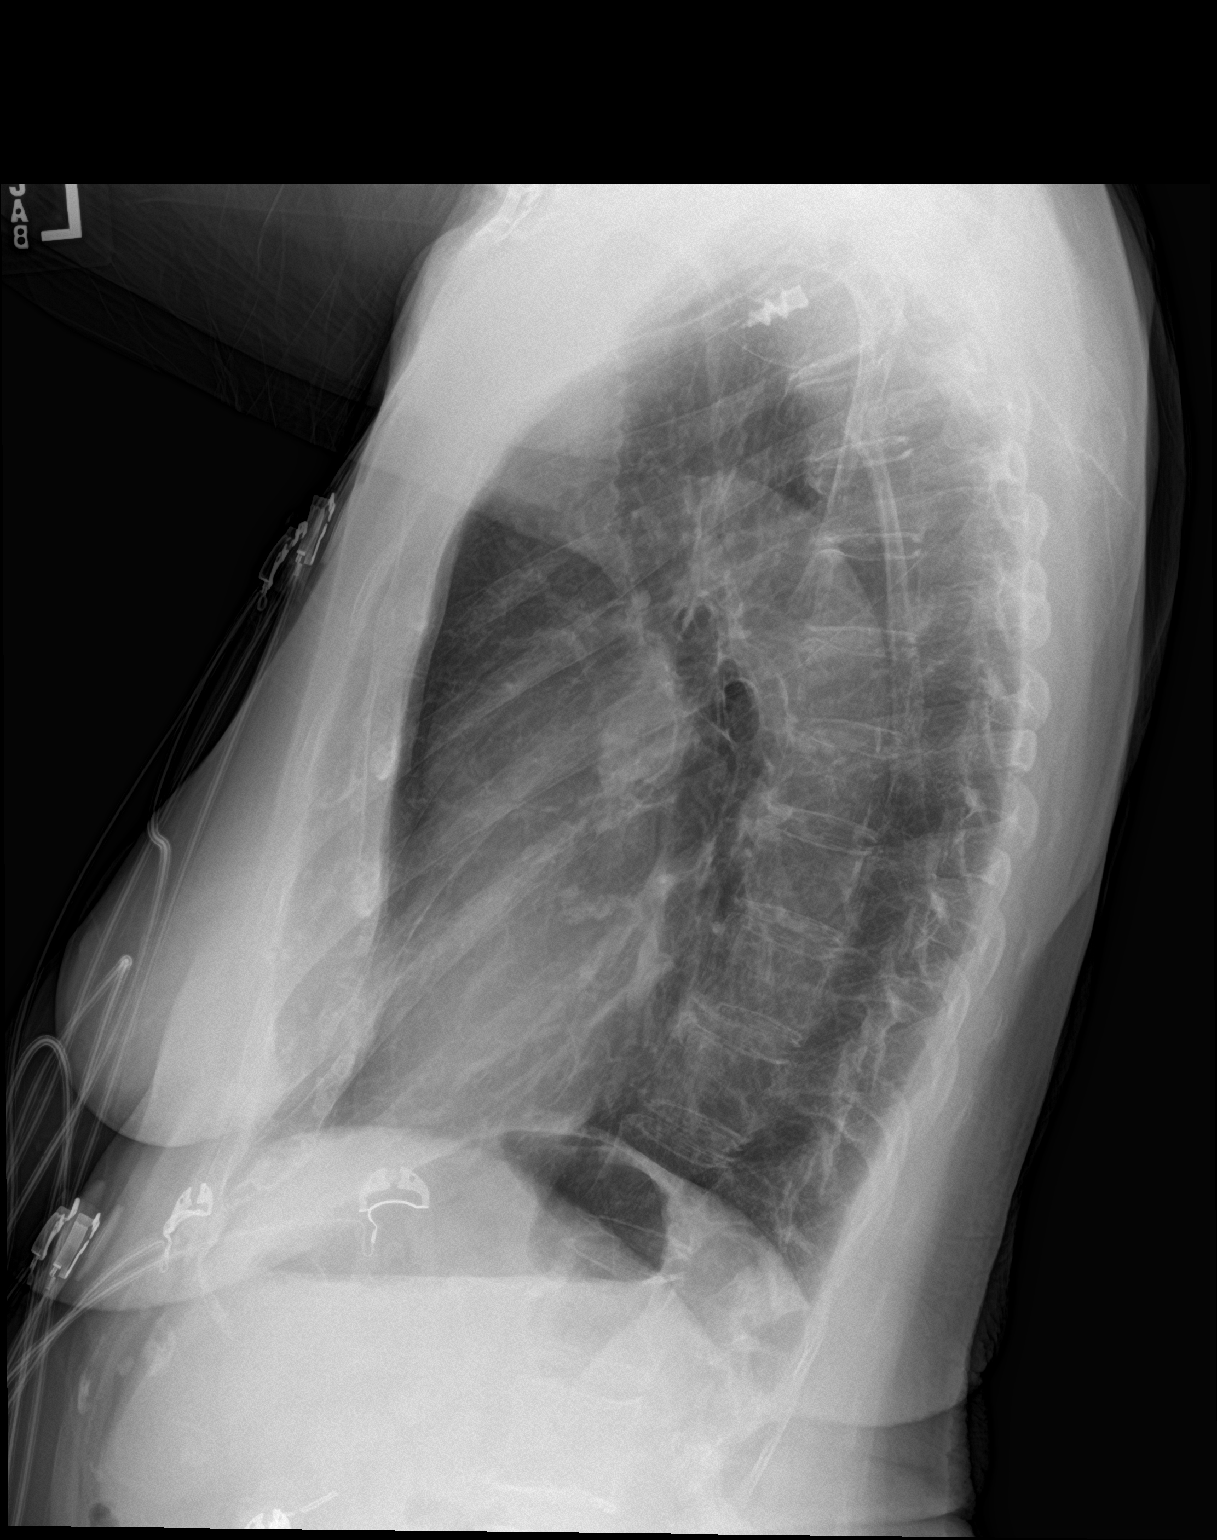

[2 of 2 positions shown; findings below may reference images not displayed]

FINDINGS: Lungs are clear, hyperinflated.

Heart size upper limits normal. Aortic Atherosclerosis
(DYGS1-170.0).

No effusion.  No pneumothorax.

Fixation hardware in the proximal left humerus partially visualized.
Surgical clips in the upper abdomen.
IMPRESSION: No acute cardiopulmonary disease.

## 2020-03-17 ENCOUNTER — Other Ambulatory Visit: Payer: Self-pay | Admitting: Neurology

## 2020-03-17 DIAGNOSIS — R519 Headache, unspecified: Secondary | ICD-10-CM

## 2020-03-17 DIAGNOSIS — R29818 Other symptoms and signs involving the nervous system: Secondary | ICD-10-CM

## 2020-03-17 DIAGNOSIS — Z8673 Personal history of transient ischemic attack (TIA), and cerebral infarction without residual deficits: Secondary | ICD-10-CM

## 2020-03-18 DIAGNOSIS — Z8673 Personal history of transient ischemic attack (TIA), and cerebral infarction without residual deficits: Secondary | ICD-10-CM | POA: Insufficient documentation

## 2020-03-24 ENCOUNTER — Ambulatory Visit
Admission: RE | Admit: 2020-03-24 | Discharge: 2020-03-24 | Disposition: A | Discharge status: Home / Self Care | Payer: Medicare Other | Source: Ambulatory Visit | Attending: Neurology | Admitting: Neurology

## 2020-03-24 ENCOUNTER — Other Ambulatory Visit: Payer: Self-pay

## 2020-03-24 DIAGNOSIS — R29818 Other symptoms and signs involving the nervous system: Secondary | ICD-10-CM

## 2020-03-24 DIAGNOSIS — R519 Headache, unspecified: Secondary | ICD-10-CM

## 2020-03-24 DIAGNOSIS — Z8673 Personal history of transient ischemic attack (TIA), and cerebral infarction without residual deficits: Secondary | ICD-10-CM

## 2020-03-24 MED ORDER — GADOBENATE DIMEGLUMINE 529 MG/ML IV SOLN
11.0000 mL | Freq: Once | INTRAVENOUS | Status: AC | PRN
  Administered 2020-03-24: 11 mL via INTRAVENOUS

## 2020-04-15 ENCOUNTER — Other Ambulatory Visit: Payer: Medicare Other

## 2020-05-25 IMAGING — CT CT RENAL STONE PROTOCOL
2 of 4 series · 16 of 46 positions shown, 18 images · non-contrast
Comparison: None.

CLINICAL DATA: Right flank pain rule out kidney stone

EXAM:
CT ABDOMEN AND PELVIS WITHOUT CONTRAST
TECHNIQUE: Multidetector CT imaging of the abdomen and pelvis was performed
following the standard protocol without IV contrast.

[Series 2: stone full standard · axial · 0.68mm/px · z∈[-1004,-644]mm · 13 of 80 slices shown, 15 images]
[im 4/80  soft-tissue]
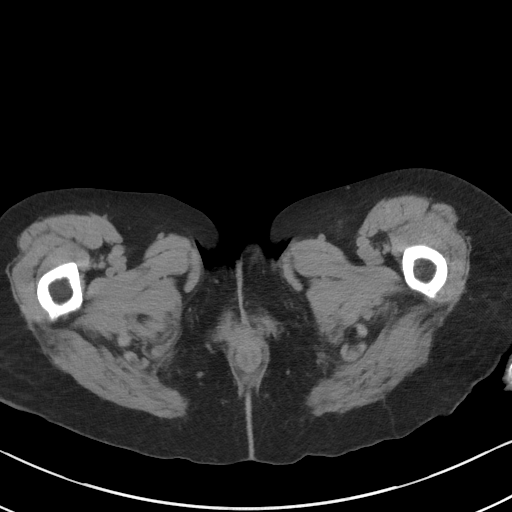
[im 4/80  bone]
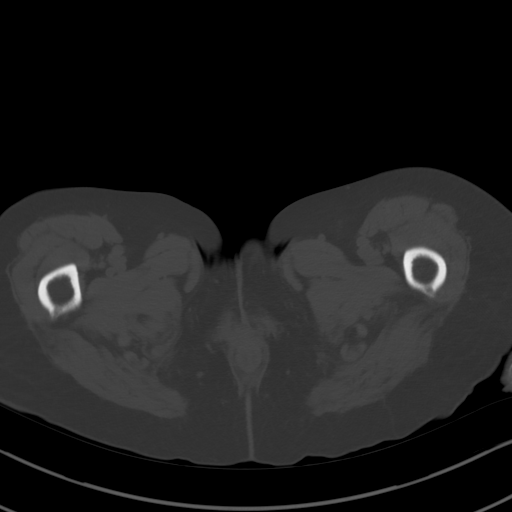
[im 10/80  soft-tissue]
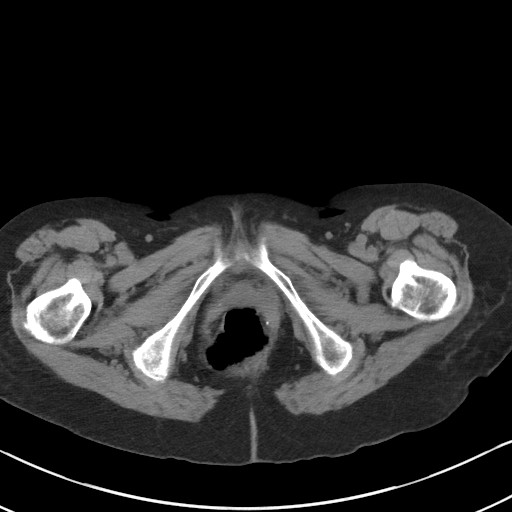
[im 16/80  soft-tissue]
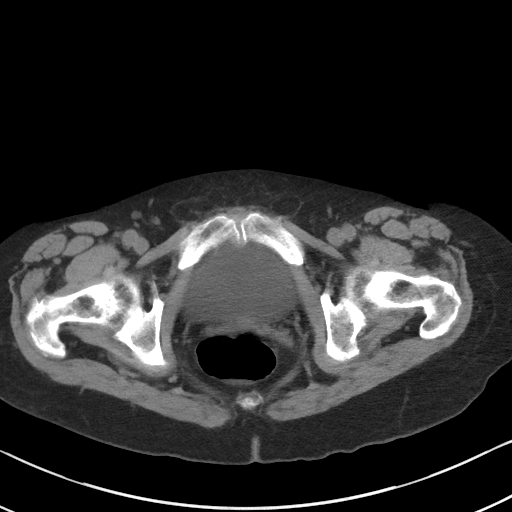
[im 23/80  soft-tissue]
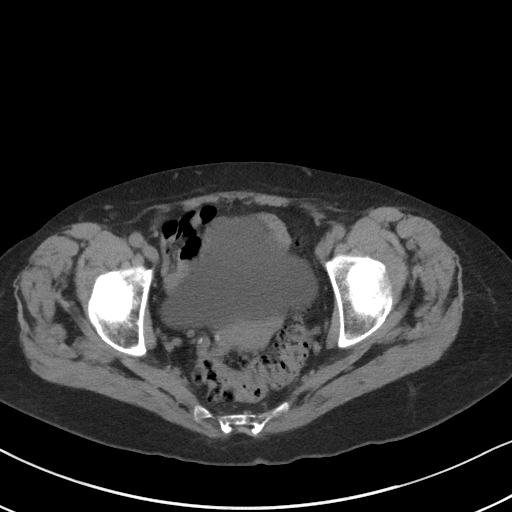
[im 29/80  soft-tissue]
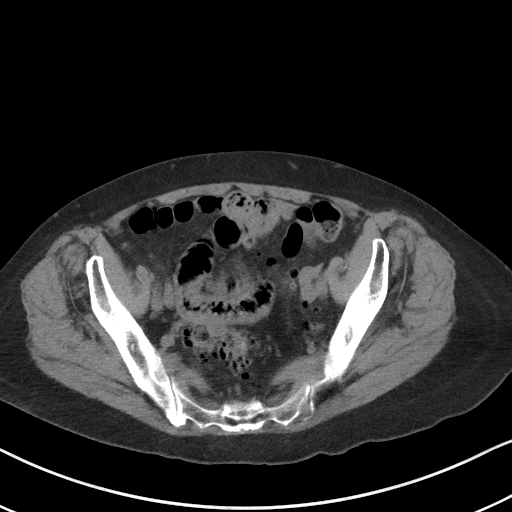
[im 35/80  soft-tissue]
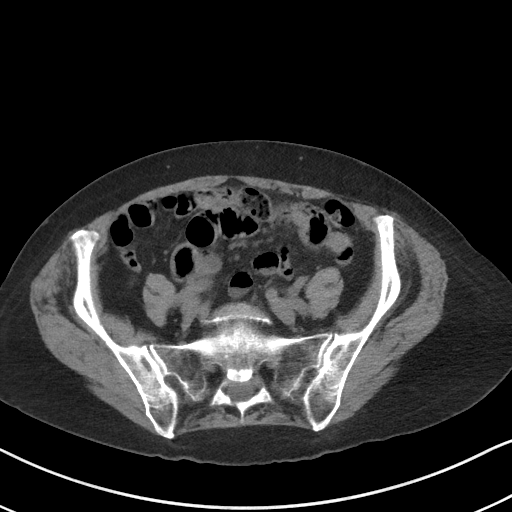
[im 42/80  soft-tissue]
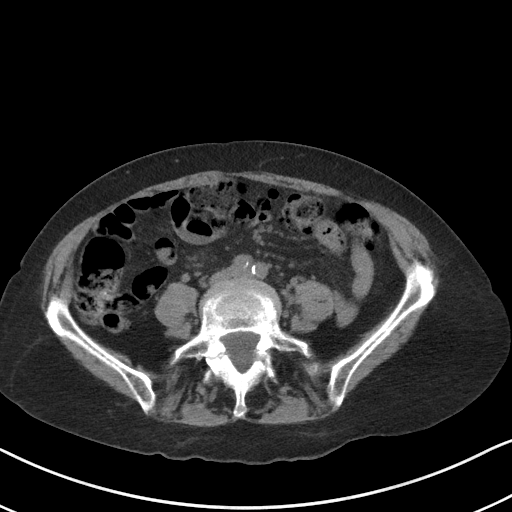
[im 45/80  soft-tissue]
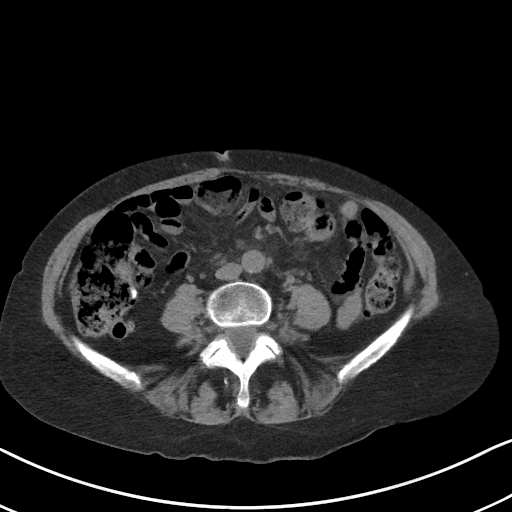
[im 51/80  soft-tissue]
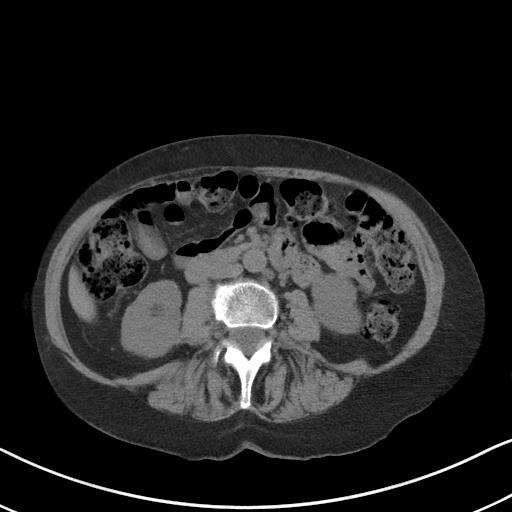
[im 51/80  bone]
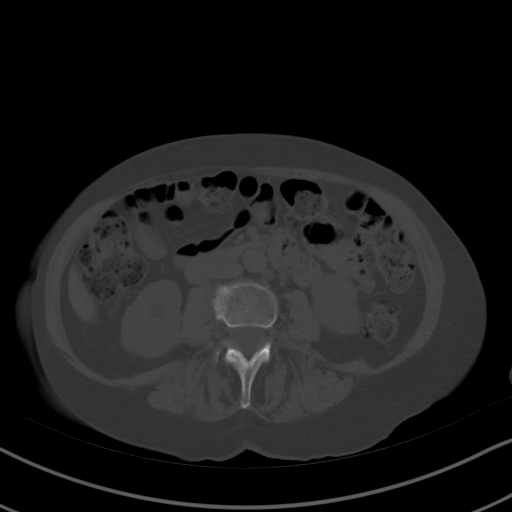
[im 57/80  soft-tissue]
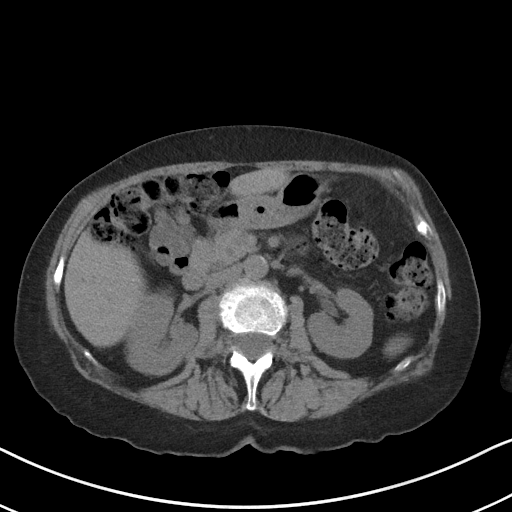
[im 64/80  soft-tissue]
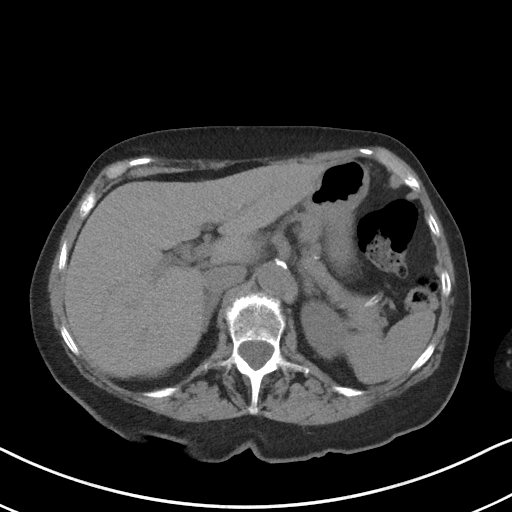
[im 70/80  soft-tissue]
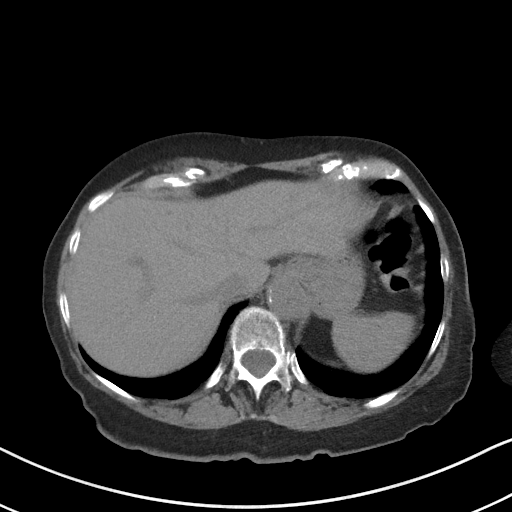
[im 76/80  soft-tissue]
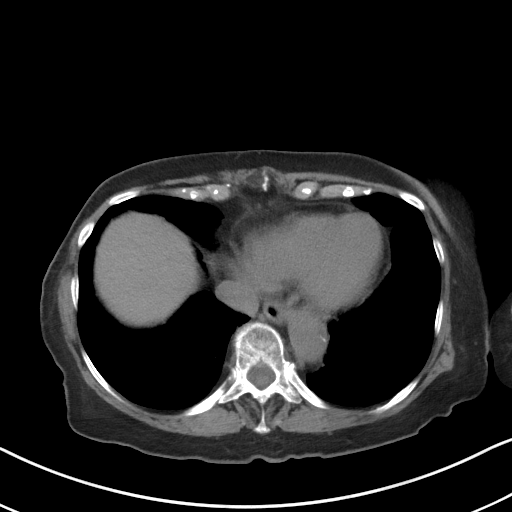

[Series 5: coronal · coronal · 0.69mm/px · 3 of 108 slices shown]
[im 36/108  soft-tissue]
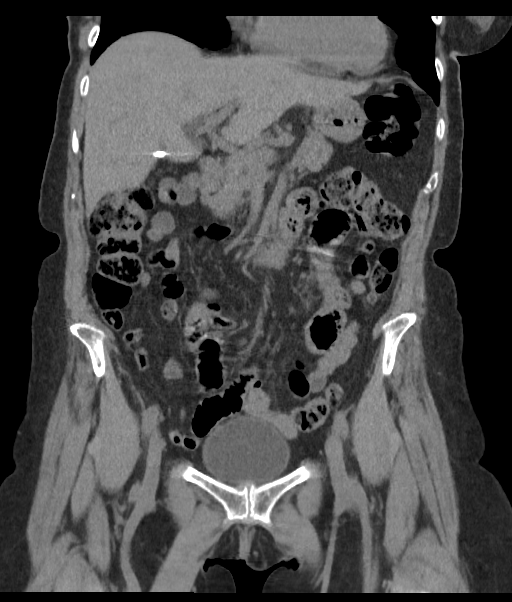
[im 48/108  soft-tissue]
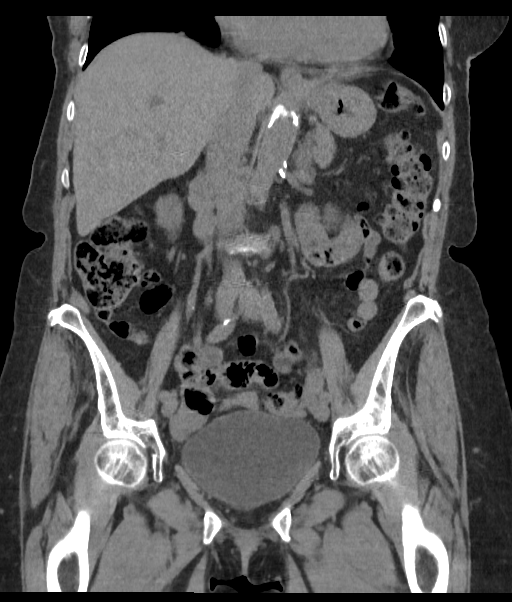
[im 60/108  soft-tissue]
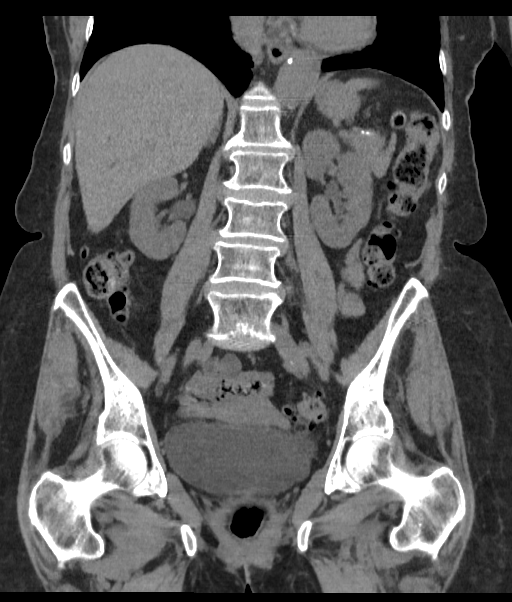

[16 of 46 positions shown; findings below may reference images not displayed]

FINDINGS: Lower chest: Lung bases clear bilaterally.

Hepatobiliary: No focal liver abnormality is seen. Status post
cholecystectomy. No biliary dilatation.

Pancreas: Negative

Spleen: Negative

Adrenals/Urinary Tract: Adrenal glands are unremarkable. Kidneys are
normal, without renal calculi, focal lesion, or hydronephrosis.
Bladder is unremarkable.

Stomach/Bowel: Stomach is within normal limits. Appendix appears
normal. No evidence of bowel wall thickening, distention, or
inflammatory changes.

Sigmoid diverticulosis without diverticulitis. Mild diverticulosis
in the cecum.

Vascular/Lymphatic: Atherosclerotic aorta without aneurysm. No
lymphadenopathy.

Reproductive: Normal uterus.  No pelvic mass.

Other: No free fluid.

Musculoskeletal: Bilateral pars defects of L5 with grade 2 slip
L5-S1 and advanced disc degeneration L5-S1. Foraminal narrowing
bilaterally L5-S1. No fracture or mass.
IMPRESSION: Negative for urinary tract calculi.

Sigmoid and cecal diverticulosis without diverticulitis

Normal appendix

Bilateral pars defects of L5 with grade 2 slip L5-S1.

## 2020-07-20 ENCOUNTER — Ambulatory Visit: Payer: Medicare Other | Admitting: Physical Therapy

## 2020-07-27 ENCOUNTER — Encounter: Payer: Medicare Other | Admitting: Physical Therapy

## 2020-08-03 ENCOUNTER — Encounter: Payer: Medicare Other | Admitting: Physical Therapy

## 2020-08-03 ENCOUNTER — Other Ambulatory Visit: Payer: Self-pay

## 2020-08-03 ENCOUNTER — Encounter: Payer: Self-pay | Admitting: Dermatology

## 2020-08-03 ENCOUNTER — Ambulatory Visit (INDEPENDENT_AMBULATORY_CARE_PROVIDER_SITE_OTHER): Payer: Medicare Other | Admitting: Dermatology

## 2020-08-03 DIAGNOSIS — L570 Actinic keratosis: Secondary | ICD-10-CM | POA: Diagnosis not present

## 2020-08-03 DIAGNOSIS — L821 Other seborrheic keratosis: Secondary | ICD-10-CM

## 2020-08-03 DIAGNOSIS — D229 Melanocytic nevi, unspecified: Secondary | ICD-10-CM

## 2020-08-03 DIAGNOSIS — D1800 Hemangioma unspecified site: Secondary | ICD-10-CM

## 2020-08-03 DIAGNOSIS — D0361 Melanoma in situ of right upper limb, including shoulder: Secondary | ICD-10-CM | POA: Diagnosis not present

## 2020-08-03 DIAGNOSIS — Z1283 Encounter for screening for malignant neoplasm of skin: Secondary | ICD-10-CM | POA: Diagnosis not present

## 2020-08-03 DIAGNOSIS — L578 Other skin changes due to chronic exposure to nonionizing radiation: Secondary | ICD-10-CM

## 2020-08-03 DIAGNOSIS — Z85828 Personal history of other malignant neoplasm of skin: Secondary | ICD-10-CM

## 2020-08-03 DIAGNOSIS — L814 Other melanin hyperpigmentation: Secondary | ICD-10-CM | POA: Diagnosis not present

## 2020-08-03 DIAGNOSIS — D485 Neoplasm of uncertain behavior of skin: Secondary | ICD-10-CM

## 2020-08-03 DIAGNOSIS — L853 Xerosis cutis: Secondary | ICD-10-CM

## 2020-08-03 DIAGNOSIS — Z85820 Personal history of malignant melanoma of skin: Secondary | ICD-10-CM

## 2020-08-03 NOTE — Progress Notes (Signed)
Follow-Up Visit   Subjective  Laurie Shields is a 86 y.o. female who presents for the following: Annual Exam (TBSE today. Hx of melanoma. Hx of SCC. Hx of skin cancer on nose. ) and Follow-up (Recheck ISK tx with Ln2 at last visit on right lower pretibia).  Pt has a spot on her left lower leg and on her scalp that she would like checked today.   The following portions of the chart were reviewed this encounter and updated as appropriate:     Review of Systems: No other skin or systemic complaints except as noted in HPI or Assessment and Plan.   Objective  Well appearing patient in no apparent distress; mood and affect are within normal limits.  A full examination was performed including scalp, head, eyes, ears, nose, lips, neck, chest, axillae, abdomen, back, buttocks, bilateral upper extremities, bilateral lower extremities, hands, feet, fingers, toes, fingernails, and toenails. All findings within normal limits unless otherwise noted below.  Objective  left pretibia x 2 (2): Pink keratotic papules   Objective  Right upper forearm: 2 x 1 cm brown patch with irregular pigmentation and border     Objective  Right medial scapula: 5 mm speckled brown macule        Objective  Right Elbow: 1.7 x 0.6 cm speckled brown patch with irregular pigment SK vs lentigo r/o atypia     Objective  Scalp: Stuck-on, waxy, tan-brown papuless -- Discussed benign etiology and prognosis.   R lower pretibia clear  Assessment & Plan  AK (actinic keratosis) (2) left pretibia x 2  Hypertrophic  Prior to procedure, discussed risks of blister formation, small wound, skin dyspigmentation, or rare scar following cryotherapy.    Destruction of lesion - left pretibia x 2  Destruction method: cryotherapy   Informed consent: discussed and consent obtained   Lesion destroyed using liquid nitrogen: Yes   Region frozen until ice ball extended beyond lesion: Yes   Outcome: patient  tolerated procedure well with no complications   Post-procedure details: wound care instructions given    Neoplasm of uncertain behavior of skin (3) Right upper forearm  Epidermal / dermal shaving  Lesion diameter (cm):  2 Lesion diameter (cm) comment:  2 x 1 Informed consent: discussed and consent obtained   Patient was prepped and draped in usual sterile fashion: Area prepped with alcohol. Anesthesia: the lesion was anesthetized in a standard fashion   Anesthetic:  1% lidocaine w/ epinephrine 1-100,000 buffered w/ 8.4% NaHCO3 Instrument used: flexible razor blade   Hemostasis achieved with: pressure, aluminum chloride and electrodesiccation   Outcome: patient tolerated procedure well   Post-procedure details: wound care instructions given   Post-procedure details comment:  Ointment and small bandage applied  Specimen 1 - Surgical pathology Differential Diagnosis: SK vs lentigo r/o atypia Check Margins: No 2 x 1 cm brown patch with irregular pigmentation and border  Right medial scapula  Epidermal / dermal shaving  Lesion diameter (cm):  0.6 Informed consent: discussed and consent obtained   Patient was prepped and draped in usual sterile fashion: Area prepped with alcohol. Anesthesia: the lesion was anesthetized in a standard fashion   Anesthetic:  1% lidocaine w/ epinephrine 1-100,000 buffered w/ 8.4% NaHCO3 Instrument used: flexible razor blade   Hemostasis achieved with: pressure, aluminum chloride and electrodesiccation   Outcome: patient tolerated procedure well   Post-procedure details: wound care instructions given   Post-procedure details comment:  Ointment and small bandage applied  Specimen 2 - Surgical  pathology Differential Diagnosis: SK vs lentigo r/o atypia Check Margins: No 5 mm speckled brown macule  Right Elbow  Epidermal / dermal shaving  Lesion diameter (cm):  2.1 Lesion diameter (cm) comment:  2.1 x 0.7 cm Informed consent: discussed and  consent obtained   Patient was prepped and draped in usual sterile fashion: Area prepped with alcohol. Anesthesia: the lesion was anesthetized in a standard fashion   Anesthetic:  1% lidocaine w/ epinephrine 1-100,000 buffered w/ 8.4% NaHCO3 Instrument used: flexible razor blade   Hemostasis achieved with: pressure, aluminum chloride and electrodesiccation   Outcome: patient tolerated procedure well   Post-procedure details: wound care instructions given   Post-procedure details comment:  Ointment and small bandage applied  Specimen 3 - Surgical pathology Differential Diagnosis: SK vs lentigo r/o atypia Check Margins: No 1.7 x 0.6 cm speckled brown patch with irregular pigment   Seborrheic keratosis Scalp  Reassured benign age-related growth.  Recommend observation.  Discussed cryotherapy if spot(s) become irritated or inflamed.   ISK on R lower pretibia is clear (txd last visit with Ln2)   Lentigines - Scattered tan macules - Discussed due to sun exposure - Benign, observe - Call for any changes  Seborrheic Keratoses - Stuck-on, waxy, tan-brown papules and plaques  - Discussed benign etiology and prognosis. - Observe - Call for any changes  Melanocytic Nevi - Tan-brown and/or pink-flesh-colored symmetric macules and papules - Benign appearing on exam today - Observation - Call clinic for new or changing moles - Recommend daily use of broad spectrum spf 30+ sunscreen to sun-exposed areas.   Hemangiomas - Red papules - Discussed benign nature - Observe - Call for any changes  Actinic Damage - Chronic, secondary to cumulative UV/sun exposure - diffuse scaly erythematous macules with underlying dyspigmentation - Recommend daily broad spectrum sunscreen SPF 30+ to sun-exposed areas, reapply every 2 hours as needed.  - Call for new or changing lesions.  Xerosis - diffuse xerotic patches - recommend gentle, hydrating skin care - gentle skin care handout  given  Skin cancer screening performed today.  History of Melanoma - No evidence of recurrence today - Recommend regular full body skin exams - Recommend daily broad spectrum sunscreen SPF 30+ to sun-exposed areas, reapply every 2 hours as needed.  - Call if any new or changing lesions are noted between office visits  History of Squamous Cell Carcinoma of the Skin - No evidence of recurrence today - Recommend regular full body skin exams - Recommend daily broad spectrum sunscreen SPF 30+ to sun-exposed areas, reapply every 2 hours as needed.  - Call if any new or changing lesions are noted between office visits  Return in about 1 year (around 08/03/2021) for TBSE, sooner pending bx.   I, Harriett Sine, CMA, am acting as scribe for Brendolyn Patty, MD.  Documentation: I have reviewed the above documentation for accuracy and completeness, and I agree with the above.  Brendolyn Patty MD

## 2020-08-03 NOTE — Patient Instructions (Signed)

## 2020-08-09 ENCOUNTER — Telehealth: Payer: Self-pay

## 2020-08-09 NOTE — Telephone Encounter (Signed)
Dr Nicole Kindred advised patient of biopsy results. Surgery for melanoma in situ of the right upper forearm scheduled 09/09/2019 at 12:30pm.

## 2020-08-09 NOTE — Telephone Encounter (Signed)
-----   Message from Brendolyn Patty, MD sent at 08/09/2020  8:42 AM EST ----- 1. Skin , right upper forearm MELANOMA IN SITU, LENTIGO MALIGNA TYPE, LATERAL MARGIN INVOLVED 2. Skin , right medial scapula PIGMENTED SEBORRHEIC KERATOSIS, EARLY 3. Skin , right elbow ACTINIC KERATOSIS AND SOLAR LENTIGO  1. Melanoma IS- needs WLE excision, discussed with patient.  Please call with surgery appointment. 2. SK- benign 3. Precancer- will treat with Ln2 if recurs.

## 2020-08-17 ENCOUNTER — Encounter: Payer: Medicare Other | Admitting: Physical Therapy

## 2020-08-19 ENCOUNTER — Other Ambulatory Visit: Payer: Self-pay | Admitting: Internal Medicine

## 2020-08-19 DIAGNOSIS — Z1231 Encounter for screening mammogram for malignant neoplasm of breast: Secondary | ICD-10-CM

## 2020-08-24 ENCOUNTER — Encounter: Payer: Medicare Other | Admitting: Physical Therapy

## 2020-09-08 ENCOUNTER — Encounter: Payer: Self-pay | Admitting: Dermatology

## 2020-09-08 ENCOUNTER — Ambulatory Visit: Payer: Medicare Other | Admitting: Dermatology

## 2020-09-08 ENCOUNTER — Encounter (INDEPENDENT_AMBULATORY_CARE_PROVIDER_SITE_OTHER): Payer: Self-pay

## 2020-09-08 ENCOUNTER — Other Ambulatory Visit: Payer: Self-pay

## 2020-09-08 DIAGNOSIS — D0361 Melanoma in situ of right upper limb, including shoulder: Secondary | ICD-10-CM

## 2020-09-08 MED ORDER — MUPIROCIN 2 % EX OINT: TOPICAL_OINTMENT | CUTANEOUS | 1 refills | Status: DC

## 2020-09-08 NOTE — Progress Notes (Signed)
   Follow-Up Visit   Subjective  Laurie Shields is a 87 y.o. female who presents for the following: Melanoma in situ (Patient here for surgery on the right upper forearm.).   The following portions of the chart were reviewed this encounter and updated as appropriate:       Review of Systems:  No other skin or systemic complaints except as noted in HPI or Assessment and Plan.  Objective  Well appearing patient in no apparent distress; mood and affect are within normal limits.  A focused examination was performed including face, arm. Relevant physical exam findings are noted in the Assessment and Plan.  Objective  Right Upper Forearm: Pink biopsy site.   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) Right Upper Forearm  Skin excision  Lesion length (cm):  2 Lesion width (cm):  1 Margin per side (cm):  0.5 Total excision diameter (cm):  3 Informed consent: discussed and consent obtained   Timeout: patient name, date of birth, surgical site, and procedure verified   Procedure prep:  Patient was prepped and draped in usual sterile fashion Prep type:  Povidone-iodine Anesthesia: the lesion was anesthetized in a standard fashion   Anesthetic:  1% lidocaine w/ epinephrine 1-100,000 buffered w/ 8.4% NaHCO3 (15 cc) Instrument used: #15 blade   Hemostasis achieved with: pressure and electrodesiccation   Outcome: patient tolerated procedure well with no complications   Additional details:  Tag at 12 o'clock superior  Skin repair Complexity:  Complex Final length (cm):  6 Informed consent: discussed and consent obtained   Reason for type of repair: reduce tension to allow closure, reduce the risk of dehiscence, infection, and necrosis, allow closure of the large defect and preserve normal anatomical and functional relationships   Undermining: area extensively undermined   Undermining comment:  3 cm undermining each side Subcutaneous layers  (deep stitches):  Suture size:  4-0 Suture type: Vicryl (polyglactin 910)   Stitches:  Buried vertical mattress Fine/surface layer approximation (top stitches):  Suture size:  4-0 Suture type: nylon   Suture type comment:  Nylon Stitches: simple running   Suture removal (days):  7 Hemostasis achieved with: suture and pressure Outcome: patient tolerated procedure well with no complications   Post-procedure details: sterile dressing applied and wound care instructions given   Dressing type: pressure dressing (mupirocin)    mupirocin ointment (BACTROBAN) 2 %  Specimen 1 - Surgical pathology Differential Diagnosis: Melanoma in situ Check Margins: Yes Pink biopsy site. [id number] Tag at 12 o'clock superior  Start Mupirocin ointment daily with bandage change.   Return in about 1 week (around 09/15/2020) for suture removal.   I, Cherlyn Labella, CMA, am acting as scribe for Willeen Niece, MD .  Documentation: I have reviewed the above documentation for accuracy and completeness, and I agree with the above.  Willeen Niece MD

## 2020-09-08 NOTE — Patient Instructions (Addendum)
Wound Care Instructions  1. Cleanse wound gently with soap and water once a day then pat dry with clean gauze. Apply a thing coat of Petrolatum (petroleum jelly, "Vaseline") over the wound (unless you have an allergy to this). We recommend that you use a new, sterile tube of Vaseline. Do not pick or remove scabs. Do not remove the yellow or white "healing tissue" from the base of the wound.  2. Cover the wound with fresh, clean, nonstick gauze and secure with paper tape. You may use Band-Aids in place of gauze and tape if the would is small enough, but would recommend trimming much of the tape off as there is often too much. Sometimes Band-Aids can irritate the skin.  3. You should call the office for your biopsy report after 1 week if you have not already been contacted.  4. If you experience any problems, such as abnormal amounts of bleeding, swelling, significant bruising, significant pain, or evidence of infection, please call the office immediately.  5. FOR ADULT SURGERY PATIENTS: If you need something for pain relief you may take 1 extra strength Tylenol (acetaminophen) AND 2 Ibuprofen (200mg  each) together every 4 hours as needed for pain. (do not take these if you are allergic to them or if you have a reason you should not take them.) Typically, you may only need pain medication for 1 to 3 days.    Apply Mupirocin ointment once daily with bandage change.

## 2020-09-09 ENCOUNTER — Telehealth: Payer: Self-pay

## 2020-09-09 NOTE — Telephone Encounter (Signed)
Patient doing fine after yesterdays surgery.  She is having a little pain.  Discussed pain regimen and advised pt to call if any problems./sh

## 2020-09-15 ENCOUNTER — Encounter: Payer: Self-pay | Admitting: Dermatology

## 2020-09-15 ENCOUNTER — Ambulatory Visit (INDEPENDENT_AMBULATORY_CARE_PROVIDER_SITE_OTHER): Payer: Medicare Other | Admitting: Dermatology

## 2020-09-15 ENCOUNTER — Other Ambulatory Visit: Payer: Self-pay

## 2020-09-15 DIAGNOSIS — Z872 Personal history of diseases of the skin and subcutaneous tissue: Secondary | ICD-10-CM

## 2020-09-15 DIAGNOSIS — D0361 Melanoma in situ of right upper limb, including shoulder: Secondary | ICD-10-CM

## 2020-09-15 NOTE — Progress Notes (Signed)
   Follow-Up Visit   Subjective  Laurie Shields is a 87 y.o. female who presents for the following: Melanoma IS margins free bx proven (R forearm, 1 wk f/u pt presents for suture removal) and hx ak bx proven (R elbow, bx 08/03/20, recheck today).   The following portions of the chart were reviewed this encounter and updated as appropriate:       Review of Systems:  No other skin or systemic complaints except as noted in HPI or Assessment and Plan.  Objective  Well appearing patient in no apparent distress; mood and affect are within normal limits.  A focused examination was performed including R arm. Relevant physical exam findings are noted in the Assessment and Plan.  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 Discussed pathology results.   Other Related Medications mupirocin ointment (BACTROBAN) 2 %  History of actinic keratoses R elbow  Bx proven Clear today  Return in about 6 months (around 03/15/2021) for TBSE, Hx of Melanoma IS, Hx AKs.   I, Othelia Pulling, RMA, am acting as scribe for Brendolyn Patty, MD . Documentation: I have reviewed the above documentation for accuracy and completeness, and I agree with the above.  Brendolyn Patty MD

## 2020-09-28 ENCOUNTER — Ambulatory Visit
Admission: RE | Admit: 2020-09-28 | Discharge: 2020-09-28 | Disposition: A | Discharge status: Home / Self Care | Payer: Medicare Other | Source: Ambulatory Visit | Attending: Internal Medicine | Admitting: Internal Medicine

## 2020-09-28 ENCOUNTER — Other Ambulatory Visit: Payer: Self-pay

## 2020-09-28 DIAGNOSIS — Z1231 Encounter for screening mammogram for malignant neoplasm of breast: Secondary | ICD-10-CM | POA: Insufficient documentation

## 2020-12-02 ENCOUNTER — Other Ambulatory Visit: Payer: Self-pay | Admitting: Obstetrics and Gynecology

## 2020-12-02 DIAGNOSIS — N6001 Solitary cyst of right breast: Secondary | ICD-10-CM

## 2020-12-03 ENCOUNTER — Other Ambulatory Visit: Payer: Self-pay | Admitting: Obstetrics and Gynecology

## 2020-12-03 DIAGNOSIS — N6001 Solitary cyst of right breast: Secondary | ICD-10-CM

## 2020-12-07 ENCOUNTER — Other Ambulatory Visit: Payer: Self-pay

## 2020-12-07 ENCOUNTER — Ambulatory Visit
Admission: RE | Admit: 2020-12-07 | Discharge: 2020-12-07 | Disposition: A | Discharge status: Home / Self Care | Payer: Medicare Other | Source: Ambulatory Visit | Attending: Obstetrics and Gynecology | Admitting: Obstetrics and Gynecology

## 2020-12-07 DIAGNOSIS — N6001 Solitary cyst of right breast: Secondary | ICD-10-CM

## 2021-03-22 ENCOUNTER — Ambulatory Visit: Payer: Medicare Other | Admitting: Dermatology

## 2021-05-23 ENCOUNTER — Ambulatory Visit: Payer: Medicare Other | Admitting: Dermatology

## 2021-05-31 ENCOUNTER — Other Ambulatory Visit: Payer: Self-pay

## 2021-05-31 ENCOUNTER — Ambulatory Visit: Payer: Medicare Other | Admitting: Dermatology

## 2021-05-31 DIAGNOSIS — L853 Xerosis cutis: Secondary | ICD-10-CM | POA: Diagnosis not present

## 2021-05-31 DIAGNOSIS — Z86006 Personal history of melanoma in-situ: Secondary | ICD-10-CM

## 2021-05-31 DIAGNOSIS — D692 Other nonthrombocytopenic purpura: Secondary | ICD-10-CM

## 2021-05-31 DIAGNOSIS — Z85828 Personal history of other malignant neoplasm of skin: Secondary | ICD-10-CM

## 2021-05-31 DIAGNOSIS — D229 Melanocytic nevi, unspecified: Secondary | ICD-10-CM

## 2021-05-31 DIAGNOSIS — L821 Other seborrheic keratosis: Secondary | ICD-10-CM

## 2021-05-31 DIAGNOSIS — Z85820 Personal history of malignant melanoma of skin: Secondary | ICD-10-CM | POA: Diagnosis not present

## 2021-05-31 DIAGNOSIS — S00511A Abrasion of lip, initial encounter: Secondary | ICD-10-CM

## 2021-05-31 DIAGNOSIS — L570 Actinic keratosis: Secondary | ICD-10-CM | POA: Diagnosis not present

## 2021-05-31 DIAGNOSIS — Z1283 Encounter for screening for malignant neoplasm of skin: Secondary | ICD-10-CM | POA: Diagnosis not present

## 2021-05-31 DIAGNOSIS — D1800 Hemangioma unspecified site: Secondary | ICD-10-CM

## 2021-05-31 DIAGNOSIS — T148XXA Other injury of unspecified body region, initial encounter: Secondary | ICD-10-CM

## 2021-05-31 DIAGNOSIS — L578 Other skin changes due to chronic exposure to nonionizing radiation: Secondary | ICD-10-CM

## 2021-05-31 DIAGNOSIS — L814 Other melanin hyperpigmentation: Secondary | ICD-10-CM

## 2021-05-31 NOTE — Progress Notes (Signed)
Follow-Up Visit   Subjective  Laurie Shields is a 87 y.o. female who presents for the following: Annual Exam (Patient here today for 6 month tbse. She has history of melanoma at right upper forearm and right ankle. ).  Patient here for full body skin exam and skin cancer screening.   The following portions of the chart were reviewed this encounter and updated as appropriate:      Review of Systems: No other skin or systemic complaints except as noted in HPI or Assessment and Plan.   Objective  Well appearing patient in no apparent distress; mood and affect are within normal limits.  A full examination was performed including scalp, head, eyes, ears, nose, lips, neck, chest, axillae, abdomen, back, buttocks, bilateral upper extremities, bilateral lower extremities, hands, feet, fingers, toes, fingernails, and toenails. All findings within normal limits unless otherwise noted below.  arms Xerosis   Left chest x 1, left upper arm above elbow x 1 (2) Erythematous thin papules/macules with gritty scale.   left clavicle 1.4 cm x 1.2 cm tan patch        left lower lip below vermillion border Crusted Excoriation- just noticed couple days ago  Assessment & Plan  Xerosis cutis arms  Recommend mild soap and moisturizing cream 1-2 times daily.  Gentle skin care handout provided.   Actinic keratosis (2) Left chest x 1, left upper arm above elbow x 1  Actinic keratoses are precancerous spots that appear secondary to cumulative UV radiation exposure/sun exposure over time. They are chronic with expected duration over 1 year. A portion of actinic keratoses will progress to squamous cell carcinoma of the skin. It is not possible to reliably predict which spots will progress to skin cancer and so treatment is recommended to prevent development of skin cancer.  Recommend daily broad spectrum sunscreen SPF 30+ to sun-exposed areas, reapply every 2 hours as needed.  Recommend staying in  the shade or wearing long sleeves, sun glasses (UVA+UVB protection) and wide brim hats (4-inch brim around the entire circumference of the hat). Call for new or changing lesions.  Destruction of lesion - Left chest x 1, left upper arm above elbow x 1  Destruction method: cryotherapy   Informed consent: discussed and consent obtained   Lesion destroyed using liquid nitrogen: Yes   Region frozen until ice ball extended beyond lesion: Yes   Outcome: patient tolerated procedure well with no complications   Post-procedure details: wound care instructions given   Additional details:  Prior to procedure, discussed risks of blister formation, small wound, skin dyspigmentation, or rare scar following cryotherapy. Recommend Vaseline ointment to treated areas while healing.   Lentigines left clavicle  Lentigo vs flat sk  Benign-appearing.  Observation.  Call clinic for new or changing lesions.  Recommend daily use of broad spectrum spf 30+ sunscreen to sun-exposed areas.    Excoriation left lower lip below vermillion border  Benign-appearing.  Observation.  Call clinic for new or changing lesions.  Recommend daily use of broad spectrum spf 30+ sunscreen to sun-exposed areas.   Recheck at follow up. Pt will RTC if doesn't heal  Lentigines - Scattered tan macules,  - Due to sun exposure - Benign-appearing, observe - Recommend daily broad spectrum sunscreen SPF 30+ to sun-exposed areas, reapply every 2 hours as needed. - Call for any changes  Seborrheic Keratoses - Stuck-on, waxy, tan-brown papules and/or plaques  - Benign-appearing - Discussed benign etiology and prognosis. - Observe - Call for any  changes  Purpura - Chronic; persistent and recurrent.  Treatable, but not curable. - Violaceous macules and patches - Benign - Related to trauma, age, sun damage and/or use of blood thinners, chronic use of topical and/or oral steroids - Observe - Can use OTC arnica containing  moisturizer such as Dermend Bruise Formula if desired - Call for worsening or other concerns  Melanocytic Nevi - Tan-brown and/or pink-flesh-colored symmetric macules and papules - Benign appearing on exam today - Observation - Call clinic for new or changing moles - Recommend daily use of broad spectrum spf 30+ sunscreen to sun-exposed areas.   Hemangiomas - Red papules - Discussed benign nature - Observe - Call for any changes  Actinic Damage - Chronic condition, secondary to cumulative UV/sun exposure - diffuse scaly erythematous macules with underlying dyspigmentation - Recommend daily broad spectrum sunscreen SPF 30+ to sun-exposed areas, reapply every 2 hours as needed.  - Staying in the shade or wearing long sleeves, sun glasses (UVA+UVB protection) and wide brim hats (4-inch brim around the entire circumference of the hat) are also recommended for sun protection.  - Call for new or changing lesions.  History of Squamous Cell Carcinoma of the Skin - No evidence of recurrence today right pretibia  - Recommend regular full body skin exams - Recommend daily broad spectrum sunscreen SPF 30+ to sun-exposed areas, reapply every 2 hours as needed.  - Call if any new or changing lesions are noted between office visits  History of Skin Cancer  Clear. Observe for recurrence.  Left pretibia hyperpigmentation at edge of smooth white scar. Scar at left lower calf. Also patient has history of Mohs at nose.  All treated years ago. Call clinic for new or changing lesions.   Recommend regular skin exams, daily broad-spectrum spf 30+ sunscreen use, and photoprotection.     History of Melanoma - No evidence of recurrence today R ankle - Recommend regular full body skin exams - Recommend daily broad spectrum sunscreen SPF 30+ to sun-exposed areas, reapply every 2 hours as needed.  - Call if any new or changing lesions are noted between office visits  History of Melanoma in Situ - No  evidence of recurrence today at right upper forearm excised 2022 - Recommend regular full body skin exams - Recommend daily broad spectrum sunscreen SPF 30+ to sun-exposed areas, reapply every 2 hours as needed.  - Call if any new or changing lesions are noted between office visits  Skin cancer screening performed today.  Return in about 6 months (around 11/28/2021) for tbse. I, Ruthell Rummage, CMA, am acting as scribe for Brendolyn Patty, MD.  Documentation: I have reviewed the above documentation for accuracy and completeness, and I agree with the above.  Brendolyn Patty MD

## 2021-05-31 NOTE — Patient Instructions (Addendum)
Gentle Skin Care Guide  1. Bathe no more than once a day.  2. Avoid bathing in hot water  3. Use a mild soap like Dove, Vanicream, Cetaphil, CeraVe. Can use Lever 2000 or Cetaphil antibacterial soap  4. Use soap only where you need it. On most days, use it under your arms, between your legs, and on your feet. Let the water rinse other areas unless visibly dirty.  5. When you get out of the bath/shower, use a towel to gently blot your skin dry, don't rub it.  6. While your skin is still a little damp, apply a moisturizing cream such as Vanicream, CeraVe, Cetaphil, Eucerin, Sarna lotion or plain Vaseline Jelly. For hands apply Neutrogena [place] Hand Cream or Excipial Hand Cream.  7. Reapply moisturizer any time you start to itch or feel dry.  8. Sometimes using free and clear laundry detergents can be helpful. Fabric softener sheets should be avoided. Downy Free & Gentle liquid, or any liquid fabric softener that is free of dyes and perfumes, it acceptable to use  9. If your doctor has given you prescription creams you may apply moisturizers over them   Seborrheic Keratosis  What causes seborrheic keratoses? Seborrheic keratoses are harmless, common skin growths that first appear during adult life.  As time goes by, more growths appear.  Some people may develop a large number of them.  Seborrheic keratoses appear on both covered and uncovered body parts.  They are not caused by sunlight.  The tendency to develop seborrheic keratoses can be inherited.  They vary in color from skin-colored to gray, brown, or even black.  They can be either smooth or have a rough, warty surface.   Seborrheic keratoses are superficial and look as if they were stuck on the skin.  Under the microscope this type of keratosis looks like layers upon layers of skin.  That is why at times the top layer may seem to fall off, but the rest of the growth remains and re-grows.    Treatment Seborrheic keratoses do not  need to be treated, but can easily be removed in the office.  Seborrheic keratoses often cause symptoms when they rub on clothing or jewelry.  Lesions can be in the way of shaving.  If they become inflamed, they can cause itching, soreness, or burning.  Removal of a seborrheic keratosis can be accomplished by freezing, burning, or surgery. If any spot bleeds, scabs, or grows rapidly, please return to have it checked, as these can be an indication of a skin cancer.  Actinic keratoses are precancerous spots that appear secondary to cumulative UV radiation exposure/sun exposure over time. They are chronic with expected duration over 1 year. A portion of actinic keratoses will progress to squamous cell carcinoma of the skin. It is not possible to reliably predict which spots will progress to skin cancer and so treatment is recommended to prevent development of skin cancer.  Recommend daily broad spectrum sunscreen SPF 30+ to sun-exposed areas, reapply every 2 hours as needed.  Recommend staying in the shade or wearing long sleeves, sun glasses (UVA+UVB protection) and wide brim hats (4-inch brim around the entire circumference of the hat). Call for new or changing lesions.   Cryotherapy Aftercare  Wash gently with soap and water everyday.   Apply Vaseline and Band-Aid daily until healed.    Melanoma ABCDEs  Melanoma is the most dangerous type of skin cancer, and is the leading cause of death from skin disease.  You are more likely to develop melanoma if you: Have light-colored skin, light-colored eyes, or red or blond hair Spend a lot of time in the sun Tan regularly, either outdoors or in a tanning bed Have had blistering sunburns, especially during childhood Have a close family member who has had a melanoma Have atypical moles or large birthmarks  Early detection of melanoma is key since treatment is typically straightforward and cure rates are extremely high if we catch it early.   The  first sign of melanoma is often a change in a mole or a new dark spot.  The ABCDE system is a way of remembering the signs of melanoma.  A for asymmetry:  The two halves do not match. B for border:  The edges of the growth are irregular. C for color:  A mixture of colors are present instead of an even brown color. D for diameter:  Melanomas are usually (but not always) greater than 66mm - the size of a pencil eraser. E for evolution:  The spot keeps changing in size, shape, and color.  Please check your skin once per month between visits. You can use a small mirror in front and a large mirror behind you to keep an eye on the back side or your body.   If you see any new or changing lesions before your next follow-up, please call to schedule a visit.  Please continue daily skin protection including broad spectrum sunscreen SPF 30+ to sun-exposed areas, reapplying every 2 hours as needed when you're outdoors.   Staying in the shade or wearing long sleeves, sun glasses (UVA+UVB protection) and wide brim hats (4-inch brim around the entire circumference of the hat) are also recommended for sun protection.    If you have any questions or concerns for your doctor, please call our main line at [phone] and press option 4 to reach your doctor's medical assistant. If no one answers, please leave a voicemail as directed and we will return your call as soon as possible. Messages left after 4 pm will be answered the following business day.   You may also send [place] a message via [place]. We typically respond to MyChart messages within 1-2 business days.  For prescription refills, please ask your pharmacy to contact our office. Our fax number is [phone].  If you have an urgent issue when the clinic is closed that cannot wait until the next business day, you can page your doctor at the number below.    Please note that while we do our best to be available for urgent issues outside of office hours, we  are not available 24/7.   If you have an urgent issue and are unable to reach [place], you may choose to seek medical care at your doctor's office, retail clinic, urgent care center, or emergency room.  If you have a medical emergency, please immediately call 911 or go to the emergency department.  Pager Numbers  - Dr. Nehemiah Massed: [phone]  - Dr. Laurence Ferrari: [phone]  - Dr. Nicole Kindred: [phone]  In the event of inclement weather, please call our main line at [phone] for an update on the status of any delays or closures.  Dermatology Medication Tips: Please keep the boxes that topical medications come in in order to help keep track of the instructions about where and how to use these. Pharmacies typically print the medication instructions only on the boxes and not directly on the medication tubes.   If your medication is too  expensive, please contact our office at [phone] option 4 or send [place] a message through [place].   We are unable to tell what your co-pay for medications will be in advance as this is different depending on your insurance coverage. However, we may be able to find a substitute medication at lower cost or fill out paperwork to get insurance to cover a needed medication.   If a prior authorization is required to get your medication covered by your insurance company, please allow [place] 1-2 business days to complete this process.  Drug prices often vary depending on where the prescription is filled and some pharmacies may offer cheaper prices.  The website www.goodrx.com contains coupons for medications through different pharmacies. The prices here do not account for what the cost may be with help from insurance (it may be cheaper with your insurance), but the website can give you the price if you did not use any insurance.  - You can print the associated coupon and take it with your prescription to the pharmacy.  - You may also stop by our office during regular business  hours and pick up a GoodRx coupon card.  - If you need your prescription sent electronically to a different pharmacy, notify our office through [facility] or by phone at [phone] option 4.

## 2021-08-11 ENCOUNTER — Other Ambulatory Visit: Payer: Self-pay

## 2021-08-11 ENCOUNTER — Ambulatory Visit: Payer: Medicare Other | Admitting: Dermatology

## 2021-08-11 DIAGNOSIS — Z85828 Personal history of other malignant neoplasm of skin: Secondary | ICD-10-CM

## 2021-08-11 DIAGNOSIS — Z85820 Personal history of malignant melanoma of skin: Secondary | ICD-10-CM | POA: Diagnosis not present

## 2021-08-11 DIAGNOSIS — D2372 Other benign neoplasm of skin of left lower limb, including hip: Secondary | ICD-10-CM | POA: Diagnosis not present

## 2021-08-11 DIAGNOSIS — C44722 Squamous cell carcinoma of skin of right lower limb, including hip: Secondary | ICD-10-CM | POA: Diagnosis not present

## 2021-08-11 DIAGNOSIS — D485 Neoplasm of uncertain behavior of skin: Secondary | ICD-10-CM

## 2021-08-11 DIAGNOSIS — S70312A Abrasion, left thigh, initial encounter: Secondary | ICD-10-CM | POA: Diagnosis not present

## 2021-08-11 DIAGNOSIS — C4492 Squamous cell carcinoma of skin, unspecified: Secondary | ICD-10-CM

## 2021-08-11 DIAGNOSIS — D0361 Melanoma in situ of right upper limb, including shoulder: Secondary | ICD-10-CM | POA: Diagnosis not present

## 2021-08-11 DIAGNOSIS — C4442 Squamous cell carcinoma of skin of scalp and neck: Secondary | ICD-10-CM | POA: Diagnosis not present

## 2021-08-11 HISTORY — DX: Squamous cell carcinoma of skin, unspecified: C44.92

## 2021-08-11 MED ORDER — MUPIROCIN 2 % EX OINT: TOPICAL_OINTMENT | CUTANEOUS | 1 refills | Status: DC

## 2021-08-11 NOTE — Progress Notes (Signed)
Follow-Up Visit   Subjective  Laurie Shields is a 87 y.o. female who presents for the following: Irregular skin lesion (On the R lower leg x 6 weeks - growing larger, tender, patient is concerned about skin cancer since she has a history of MM and SCC.). She also has a very tender, irregular, irritated skin lesion on her R post neck that she would like checked today and a possible bug bite on her leg that she would like to discuss treatment options for.   The following portions of the chart were reviewed this encounter and updated as appropriate:   Tobacco  Allergies  Meds  Problems  Med Hx  Surg Hx  Fam Hx      Review of Systems:  No other skin or systemic complaints except as noted in HPI or Assessment and Plan.  Objective  Well appearing patient in no apparent distress; mood and affect are within normal limits.  A focused examination was performed including the R lower leg and neck . Relevant physical exam findings are noted in the Assessment and Plan.  R pretibia 0.8 cm firm pink papule.      R upper back at base of neck 0.3 cm pink papule with cutaneous horn.     L post thigh Erosion with crust. Subcutaneous nodule.    Assessment & Plan  Neoplasm of uncertain behavior of skin (2) R pretibia  Skin / nail biopsy Type of biopsy: tangential   Informed consent: discussed and consent obtained   Timeout: patient name, date of birth, surgical site, and procedure verified   Procedure prep:  Patient was prepped and draped in usual sterile fashion Prep type:  Isopropyl alcohol Anesthesia: the lesion was anesthetized in a standard fashion   Anesthetic:  1% lidocaine w/ epinephrine 1-100,000 buffered w/ 8.4% NaHCO3 Instrument used: flexible razor blade   Hemostasis achieved with: pressure, aluminum chloride and electrodesiccation   Outcome: patient tolerated procedure well   Post-procedure details: sterile dressing applied and wound care instructions given   Dressing  type: bandage and petrolatum    Specimen 1 - Surgical pathology Differential Diagnosis: D48.5 r/o SCC  Check Margins: No  R upper back at base of neck  Skin / nail biopsy Type of biopsy: tangential   Informed consent: discussed and consent obtained   Timeout: patient name, date of birth, surgical site, and procedure verified   Procedure prep:  Patient was prepped and draped in usual sterile fashion Prep type:  Isopropyl alcohol Anesthesia: the lesion was anesthetized in a standard fashion   Anesthetic:  1% lidocaine w/ epinephrine 1-100,000 buffered w/ 8.4% NaHCO3 Instrument used: flexible razor blade   Hemostasis achieved with: pressure, aluminum chloride and electrodesiccation   Outcome: patient tolerated procedure well   Post-procedure details: sterile dressing applied and wound care instructions given   Dressing type: bandage and petrolatum    Specimen 2 - Surgical pathology Differential Diagnosis: D48.5 r/o AK vs SCC  Check Margins: No * 2 pieces *  Start Mupirocin 2% ointment QD and cover with bandage.   Benign neoplasm of skin of lower limb, including hip, left L post thigh  Appears to be a scratch. Patient thinks she may have been bitten by a bug.  Benign appearing. Discussed wound care. Clean with warm water and soap, pat dry, and apply Mupirocin and cover with bandage until healed. Call if anything is growing or worsening.   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)  Related  Medications mupirocin ointment (BACTROBAN) 2 % Appy QD with bandage change.  Return for appointment as scheduled.  Luther Redo, CMA, am acting as scribe for Forest Gleason, MD .  Documentation: I have reviewed the above documentation for accuracy and completeness, and I agree with the above.  Forest Gleason, MD

## 2021-08-11 NOTE — Patient Instructions (Signed)

## 2021-08-16 ENCOUNTER — Encounter: Payer: Medicare Other | Admitting: Dermatology

## 2021-08-18 ENCOUNTER — Telehealth: Payer: Self-pay

## 2021-08-18 ENCOUNTER — Encounter: Payer: Self-pay | Admitting: Dermatology

## 2021-08-18 DIAGNOSIS — C4492 Squamous cell carcinoma of skin, unspecified: Secondary | ICD-10-CM

## 2021-08-18 NOTE — Telephone Encounter (Signed)
-----   Message from Alfonso Patten, MD sent at 08/18/2021 12:28 PM EST ----- 1. Skin , R pretibia WELL DIFFERENTIATED SQUAMOUS CELL CARCINOMA, BASE INVOLVED --> recommend ED&C though she would qualify for Mohs surgery which has a higher cure rate as well  2. Skin , R upper back at base of neck WELL DIFFERENTIATED SQUAMOUS CELL CARCINOMA WITH SUPERFICIAL INFILTRATION, BASE INVOLVED --> recommend ED&C  Called pt, no answer, left voicemail to call us back.  MAs please call starting afternoon 08/18/2021. Thank you!

## 2021-08-18 NOTE — Telephone Encounter (Signed)
Spoke with patient regarding BX results. She would like to go for [facility] for the R pretibia due to history of melanoma. EDC scheduled with Dr. Laurence Ferrari for 09/07/21.

## 2021-09-01 ENCOUNTER — Other Ambulatory Visit: Payer: Self-pay | Admitting: Internal Medicine

## 2021-09-01 DIAGNOSIS — Z1231 Encounter for screening mammogram for malignant neoplasm of breast: Secondary | ICD-10-CM

## 2021-09-07 ENCOUNTER — Ambulatory Visit: Payer: Medicare Other | Admitting: Dermatology

## 2021-09-07 ENCOUNTER — Other Ambulatory Visit: Payer: Self-pay

## 2021-09-07 DIAGNOSIS — C4442 Squamous cell carcinoma of skin of scalp and neck: Secondary | ICD-10-CM | POA: Diagnosis not present

## 2021-09-07 DIAGNOSIS — L570 Actinic keratosis: Secondary | ICD-10-CM | POA: Diagnosis not present

## 2021-09-07 DIAGNOSIS — L821 Other seborrheic keratosis: Secondary | ICD-10-CM | POA: Diagnosis not present

## 2021-09-07 DIAGNOSIS — C4492 Squamous cell carcinoma of skin, unspecified: Secondary | ICD-10-CM

## 2021-09-07 NOTE — Progress Notes (Signed)
Follow-Up Visit   Subjective  Laurie Shields is a 88 y.o. female who presents for the following: Follow-up (Pt here to treat SCC on right upper back at base of neck with EDC). Pt also c/o of scabs in scalp that have improved since she has noticed them. States that she has been treated with Ln2 in the past in this area.  The following portions of the chart were reviewed this encounter and updated as appropriate:  Tobacco   Allergies   Meds   Problems   Med Hx   Surg Hx   Fam Hx       Review of Systems: No other skin or systemic complaints except as noted in HPI or Assessment and Plan.   Objective  Well appearing patient in no apparent distress; mood and affect are within normal limits.  A focused examination was performed including scalp and neck. Relevant physical exam findings are noted in the Assessment and Plan.  mid frontal scalp x 1 Erythematous thin papules/macules with gritty scale.   R upper back at base of neck Biopsy proven SCC   Assessment & Plan  AK (actinic keratosis) mid frontal scalp x 1  Favored over ISK  Actinic keratoses are precancerous spots that appear secondary to cumulative UV radiation exposure/sun exposure over time. They are chronic with expected duration over 1 year. A portion of actinic keratoses will progress to squamous cell carcinoma of the skin. It is not possible to reliably predict which spots will progress to skin cancer and so treatment is recommended to prevent development of skin cancer.  Recommend daily broad spectrum sunscreen SPF 30+ to sun-exposed areas, reapply every 2 hours as needed.  Recommend staying in the shade or wearing long sleeves, sun glasses (UVA+UVB protection) and wide brim hats (4-inch brim around the entire circumference of the hat). Call for new or changing lesions.  Prior to procedure, discussed risks of blister formation, small wound, skin dyspigmentation, or rare scar following cryotherapy. Recommend Vaseline ointment  to treated areas while healing.   Destruction of lesion - mid frontal scalp x 1  Destruction method: cryotherapy   Informed consent: discussed and consent obtained   Lesion destroyed using liquid nitrogen: Yes   Outcome: patient tolerated procedure well with no complications   Post-procedure details: wound care instructions given    Squamous cell carcinoma of skin R upper back at base of neck  Destruction of lesion  Destruction method: electrodesiccation and curettage   Informed consent: discussed and consent obtained   Timeout:  patient name, date of birth, surgical site, and procedure verified Anesthesia: the lesion was anesthetized in a standard fashion   Anesthetic:  1% lidocaine w/ epinephrine 1-100,000 buffered w/ 8.4% NaHCO3 Curettage performed in three different directions: Yes   Electrodesiccation performed over the curetted area: Yes   Lesion length (cm):  0.3 Lesion width (cm):  0.3 Final wound size (cm):  0.7 Hemostasis achieved with:  electrodesiccation Outcome: patient tolerated procedure well with no complications   Post-procedure details: sterile dressing applied and wound care instructions given   Dressing type: petrolatum    Seborrheic Keratoses scalp - Stuck-on, waxy, tan-brown papules and/or plaques  - Benign-appearing - Discussed benign etiology and prognosis. - Observe - Call for any changes   Return in about 2 months (around 11/22/2021) for TBSE as scheduled.  I, Harriett Sine, CMA, am acting as scribe for Forest Gleason, MD.  Documentation: I have reviewed the above documentation for accuracy and completeness, and I  agree with the above.  Forest Gleason, MD

## 2021-09-07 NOTE — Patient Instructions (Signed)
Electrodesiccation and Curettage (Scrape and Burn) Wound Care Instructions  Leave the original bandage on for 24 hours if possible.  If the bandage becomes soaked or soiled before that time, it is OK to remove it and examine the wound.  A small amount of post-operative bleeding is normal.  If excessive bleeding occurs, remove the bandage, place gauze over the site and apply continuous pressure (no peeking) over the area for 30 minutes. If this does not work, please call our clinic as soon as possible or page your doctor if it is after hours.   Once a day, cleanse the wound with soap and water. It is fine to shower. If a thick crust develops you may use a Q-tip dipped into dilute hydrogen peroxide (mix 1:1 with water) to dissolve it.  Hydrogen peroxide can slow the healing process, so use it only as needed.    After washing, apply petroleum jelly (Vaseline) or an antibiotic ointment if your doctor prescribed one for you, followed by a bandage.    For best healing, the wound should be covered with a layer of ointment at all times. If you are not able to keep the area covered with a bandage to hold the ointment in place, this may mean re-applying the ointment several times a day.  Continue this wound care until the wound has healed and is no longer open. It may take several weeks for the wound to heal and close.  Itching and mild discomfort is normal during the healing process.  If you have any discomfort, you can take Tylenol (acetaminophen) or ibuprofen as directed on the bottle. (Please do not take these if you have an allergy to them or cannot take them for another reason).  Some redness, tenderness and white or yellow material in the wound is normal healing.  If the area becomes very sore and red, or develops a thick yellow-green material (pus), it may be infected; please notify us.    Wound healing continues for up to one year following surgery. It is not unusual to experience pain in the scar  from time to time during the interval.  If the pain becomes severe or the scar thickens, you should notify the office.    A slight amount of redness in a scar is expected for the first six months.  After six months, the redness will fade and the scar will soften and fade.  The color difference becomes less noticeable with time.  If there are any problems, return for a post-op surgery check at your earliest convenience.  To improve the appearance of the scar, you can use silicone scar gel, cream, or sheets (such as Mederma or Serica) every night for up to one year. These are available over the counter (without a prescription).  Please call our office at [phone] for any questions or concerns.   If You Need Anything After Your Visit  If you have any questions or concerns for your doctor, please call our main line at [phone] and press option 4 to reach your doctor's medical assistant. If no one answers, please leave a voicemail as directed and we will return your call as soon as possible. Messages left after 4 pm will be answered the following business day.   You may also send [place] a message via [place]. We typically respond to MyChart messages within 1-2 business days.  For prescription refills, please ask your pharmacy to contact our office. Our fax number is [phone].  If you have  an urgent issue when the clinic is closed that cannot wait until the next business day, you can page your doctor at the number below.    Please note that while we do our best to be available for urgent issues outside of office hours, we are not available 24/7.   If you have an urgent issue and are unable to reach [place], you may choose to seek medical care at your doctor's office, retail clinic, urgent care center, or emergency room.  If you have a medical emergency, please immediately call 911 or go to the emergency department.  Pager Numbers  - Dr. Nehemiah Massed: [phone]  - Dr. Laurence Ferrari: [phone]  -  Dr. Nicole Kindred: [phone]  In the event of inclement weather, please call our main line at [phone] for an update on the status of any delays or closures.  Dermatology Medication Tips: Please keep the boxes that topical medications come in in order to help keep track of the instructions about where and how to use these. Pharmacies typically print the medication instructions only on the boxes and not directly on the medication tubes.   If your medication is too expensive, please contact our office at [phone] option 4 or send [place] a message through [place].   We are unable to tell what your co-pay for medications will be in advance as this is different depending on your insurance coverage. However, we may be able to find a substitute medication at lower cost or fill out paperwork to get insurance to cover a needed medication.   If a prior authorization is required to get your medication covered by your insurance company, please allow [place] 1-2 business days to complete this process.  Drug prices often vary depending on where the prescription is filled and some pharmacies may offer cheaper prices.  The website www.goodrx.com contains coupons for medications through different pharmacies. The prices here do not account for what the cost may be with help from insurance (it may be cheaper with your insurance), but the website can give you the price if you did not use any insurance.  - You can print the associated coupon and take it with your prescription to the pharmacy.  - You may also stop by our office during regular business hours and pick up a GoodRx coupon card.  - If you need your prescription sent electronically to a different pharmacy, notify our office through [facility] or by phone at [phone] option 4.     Si Usted Necesita Algo Despus de Su Visita  Tambin puede enviarnos un mensaje a travs de Pharmacist, community. Por lo general respondemos a los mensajes de MyChart en el  transcurso de 1 a 2 das hbiles.  Para renovar recetas, por favor pida a su farmacia que se ponga en contacto con nuestra oficina. Harland Dingwall de fax es [place] [phone].  Si tiene un asunto urgente cuando la clnica est cerrada y que no puede esperar hasta el siguiente da hbil, puede llamar/localizar a su doctor(a) al nmero que aparece a continuacin.   Por favor, tenga en cuenta que aunque hacemos todo lo posible para estar disponibles para asuntos urgentes fuera del horario de [place], no estamos disponibles las 24 horas del da, los 7 das de la [place].   Si tiene un problema urgente y no puede comunicarse con nosotros, puede optar por buscar atencin mdica  en el consultorio de su doctor(a), en una clnica privada, en un centro de atencin urgente o en una sala de  emergencias.  Si tiene Engineering geologist, por favor llame inmediatamente al 911 o vaya a la sala de emergencias.  Nmeros de bper  - Dr. Nehemiah Massed: [phone]  - Dra. Moye: [phone]  - Dra. Nicole Kindred: [phone]  En caso de inclemencias del [place], por favor llame a Johnsie Kindred principal al [phone] para una actualizacin sobre el [place] de cualquier retraso o cierre.  Consejos para la medicacin en dermatologa: Por favor, guarde las cajas en las que vienen los medicamentos de uso tpico para ayudarle a seguir las instrucciones sobre dnde y cmo usarlos. Las farmacias generalmente imprimen las instrucciones del medicamento slo en las cajas y no directamente en los tubos del [place].   Si su medicamento es muy caro, por favor, pngase en contacto con Zigmund Daniel llamando al [phone] y presione la opcin 4 o envenos un mensaje a travs de Pharmacist, community.   No podemos decirle cul ser su copago por los medicamentos por adelantado ya que esto es diferente dependiendo de la cobertura de su seguro. Sin embargo, es posible que podamos encontrar un medicamento sustituto a Electrical engineer un  formulario para que el seguro cubra el medicamento que se considera necesario.   Si se requiere una autorizacin previa para que su compaa de seguros [place] su medicamento, por favor permtanos de 1 a 2 das hbiles para completar este proceso.  Los precios de los medicamentos varan con frecuencia dependiendo del Environmental consultant de dnde se surte la receta y alguna farmacias pueden ofrecer precios ms baratos.  El sitio web www.goodrx.com tiene cupones para medicamentos de Airline pilot. Los precios aqu no tienen en cuenta lo que podra costar con la ayuda del seguro (puede ser ms barato con su seguro), pero el sitio web puede darle el precio si no utiliz Research scientist (physical sciences).  - Puede imprimir el cupn correspondiente y llevarlo con su receta a la farmacia.  - Tambin puede pasar por nuestra oficina durante el horario de atencin regular y Charity fundraiser una tarjeta de cupones de GoodRx.  - Si necesita que su receta se enve electrnicamente a una farmacia diferente, informe a nuestra oficina a travs de MyChart de [place] o por telfono llamando al [phone] y presione la opcin 4.

## 2021-09-14 ENCOUNTER — Encounter: Payer: Self-pay | Admitting: Dermatology

## 2021-09-29 ENCOUNTER — Ambulatory Visit
Admission: RE | Admit: 2021-09-29 | Discharge: 2021-09-29 | Disposition: A | Discharge status: Home / Self Care | Payer: Medicare Other | Source: Ambulatory Visit | Attending: Internal Medicine | Admitting: Internal Medicine

## 2021-09-29 ENCOUNTER — Other Ambulatory Visit: Payer: Self-pay

## 2021-09-29 DIAGNOSIS — Z1231 Encounter for screening mammogram for malignant neoplasm of breast: Secondary | ICD-10-CM | POA: Diagnosis present

## 2021-10-12 ENCOUNTER — Telehealth: Payer: Self-pay

## 2021-10-12 NOTE — Telephone Encounter (Signed)
Updated history and specimen tracking from [facility]. aw

## 2021-10-14 DIAGNOSIS — C4371 Malignant melanoma of right lower limb, including hip: Secondary | ICD-10-CM | POA: Insufficient documentation

## 2021-11-22 ENCOUNTER — Other Ambulatory Visit: Payer: Self-pay

## 2021-11-22 ENCOUNTER — Ambulatory Visit: Payer: Medicare Other | Admitting: Dermatology

## 2021-11-22 DIAGNOSIS — L821 Other seborrheic keratosis: Secondary | ICD-10-CM | POA: Diagnosis not present

## 2021-11-22 DIAGNOSIS — Z85828 Personal history of other malignant neoplasm of skin: Secondary | ICD-10-CM

## 2021-11-22 DIAGNOSIS — Z1283 Encounter for screening for malignant neoplasm of skin: Secondary | ICD-10-CM

## 2021-11-22 DIAGNOSIS — D225 Melanocytic nevi of trunk: Secondary | ICD-10-CM

## 2021-11-22 DIAGNOSIS — Z86006 Personal history of melanoma in-situ: Secondary | ICD-10-CM

## 2021-11-22 DIAGNOSIS — L578 Other skin changes due to chronic exposure to nonionizing radiation: Secondary | ICD-10-CM

## 2021-11-22 DIAGNOSIS — D3610 Benign neoplasm of peripheral nerves and autonomic nervous system, unspecified: Secondary | ICD-10-CM

## 2021-11-22 DIAGNOSIS — L570 Actinic keratosis: Secondary | ICD-10-CM | POA: Diagnosis not present

## 2021-11-22 DIAGNOSIS — D3615 Benign neoplasm of peripheral nerves and autonomic nervous system of abdomen: Secondary | ICD-10-CM | POA: Diagnosis not present

## 2021-11-22 DIAGNOSIS — L814 Other melanin hyperpigmentation: Secondary | ICD-10-CM | POA: Diagnosis not present

## 2021-11-22 DIAGNOSIS — D229 Melanocytic nevi, unspecified: Secondary | ICD-10-CM

## 2021-11-22 DIAGNOSIS — Z85820 Personal history of malignant melanoma of skin: Secondary | ICD-10-CM

## 2021-11-22 DIAGNOSIS — L905 Scar conditions and fibrosis of skin: Secondary | ICD-10-CM

## 2021-11-22 NOTE — Progress Notes (Signed)
? ?Follow-Up Visit ?  ?Subjective  ?Laurie Shields is a 88 y.o. female who presents for the following: Follow-up. ? ?The patient presents for 6 month Total-Body Skin Exam (TBSE) for skin cancer screening and mole check.  The patient has spots, moles and lesions to be evaluated, some may be new or changing. 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 History of SCCs.  ? ?The following portions of the chart were reviewed this encounter and updated as appropriate:  ?  ?  ? ?Review of Systems:  No other skin or systemic complaints except as noted in HPI or Assessment and Plan. ? ?Objective  ?Well appearing patient in no apparent distress; mood and affect are within normal limits. ? ?A full examination was performed including scalp, head, eyes, ears, nose, lips, neck, chest, axillae, abdomen, back, buttocks, bilateral upper extremities, bilateral lower extremities, hands, feet, fingers, toes, fingernails, and toenails. All findings within normal limits unless otherwise noted below. ? ?left clavicle ?1.4 x 1.2 cm tan patch ? ?Right lateral upper ankle ?1.0 cm two-tone waxy tan macule ? ? ? ? ? ? ?Left lower calf ?Violaceous patch ? ?Left abdomen ?Flesh papules ? ?Left Abdomen ?4.0 mm tan macule with darker edge ? ? ? ? ? ? ?L post shoulder x 1, R post neck x 1, L upper arm x 1, L forearm x 1, R forearm x 1 (5) ?Pink/brown scaly papules ? ? ? ?Assessment & Plan  ?Skin cancer screening performed today. ?Actinic Damage ?- chronic, secondary to cumulative UV radiation exposure/sun exposure over time ?- diffuse scaly erythematous macules with underlying dyspigmentation ?- Recommend daily broad spectrum sunscreen SPF 30+ to sun-exposed areas, reapply every 2 hours as needed.  ?- Recommend staying in the shade or wearing long sleeves, sun glasses (UVA+UVB protection) and wide brim hats (4-inch brim around the entire circumference of the hat). ?- Call for new or changing  lesions. ?Lentigines ?- Scattered tan macules ?- Due to sun exposure ?- Benign-appering, observe ?- Recommend daily broad spectrum sunscreen SPF 30+ to sun-exposed areas, reapply every 2 hours as needed. ?- Call for any changes ?Seborrheic Keratoses ?- Stuck-on, waxy, tan-brown papules and/or plaques  ?- Benign-appearing ?- Discussed benign etiology and prognosis. ?- Observe ?- Call for any changes ? ?History of Melanoma ?- No evidence of recurrence today of the right ankle ?- Recommend regular full body skin exams ?- Recommend daily broad spectrum sunscreen SPF 30+ to sun-exposed areas, reapply every 2 hours as needed.  ?- Call if any new or changing lesions are noted between office visits ? ?History of Melanoma in Situ ?- No evidence of recurrence today of the right upper forearm ?- Recommend regular full body skin exams ?- Recommend daily broad spectrum sunscreen SPF 30+ to sun-exposed areas, reapply every 2 hours as needed.  ?- Call if any new or changing lesions are noted between office visits ? ?History of Squamous Cell Carcinoma of the Skin ?- No evidence of recurrence today of the right pretibia, right mid pretibia, and right upper back/base of neck.  ?- Recommend regular full body skin exams ?- Recommend daily broad spectrum sunscreen SPF 30+ to sun-exposed areas, reapply every 2 hours as needed.  ?- Call if any new or changing lesions are noted between office visits ? ?Lentigo ?left clavicle ? ?vs Flat SK ? ?Benign, Observation. Stable compared to previous exam and photo.  ? ?Seborrheic keratosis ?Right lateral upper ankle ? ?Benign-appearing.  Observation.  Call clinic for new or changing moles.  Recommend daily use of broad spectrum spf 30+ sunscreen to sun-exposed areas.   ? ?Recheck on f/up ? ?Scar ?Left lower calf ? ?H/o trauma ? ?Benign. Observation.  ? 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 ?Benign, observe.  ? ?Nevus ?Left Abdomen ? ?Benign-appearing.  Observation.  Call clinic for new or changing moles.   Recommend daily use of broad spectrum spf 30+ sunscreen to sun-exposed areas.  ? ?Actinic keratosis (5) ?L post shoulder x 1, R post neck x 1, L upper arm x 1, L forearm x 1, R forearm x 1 ? ?Hypertrophic vrs ISKs ? ?Actinic keratoses are precancerous spots that appear secondary to cumulative UV radiation exposure/sun exposure over time. They are chronic with expected duration over 1 year. A portion of actinic keratoses will progress to squamous cell carcinoma of the skin. It is not possible to reliably predict which spots will progress to skin cancer and so treatment is recommended to prevent development of skin cancer. ? ?Recommend daily broad spectrum sunscreen SPF 30+ to sun-exposed areas, reapply every 2 hours as needed.  ?Recommend staying in the shade or wearing long sleeves, sun glasses (UVA+UVB protection) and wide brim hats (4-inch brim around the entire circumference of the hat). ?Call for new or changing lesions.  ? ?Destruction of lesion - L post shoulder x 1, R post neck x 1, L upper arm x 1, L forearm x 1, R forearm x 1 ? ?Destruction method: cryotherapy   ?Informed consent: discussed and consent obtained   ?Lesion destroyed using liquid nitrogen: Yes   ?Region frozen until ice ball extended beyond lesion: Yes   ?Outcome: patient tolerated procedure well with no complications   ?Post-procedure details: wound care instructions given   ?Additional details:  Prior to procedure, discussed risks of blister formation, small wound, skin dyspigmentation, or rare scar following cryotherapy. Recommend Vaseline ointment to treated areas while healing. ? ? ? ?Return in about 6 months (around 05/25/2022) for TBSE, Hx melanoma, Hx SCC. ? ?I, Jamesetta Orleans, CMA, am acting as scribe for Brendolyn Patty, MD . ?Documentation: I have reviewed the above documentation for accuracy and completeness, and I agree with the above. ? ?Brendolyn Patty MD  ? ? ?

## 2021-11-22 NOTE — Patient Instructions (Addendum)
Cryotherapy Aftercare ? ?Wash gently with soap and water everyday.   ?Apply Vaseline and Band-Aid daily until healed.  ? ? ?Melanoma ABCDEs ? ?Melanoma is the most dangerous type of skin cancer, and is the leading cause of death from skin disease.  You are more likely to develop melanoma if you: ?Have light-colored skin, light-colored eyes, or red or blond hair ?Spend a lot of time in the sun ?Tan regularly, either outdoors or in a tanning bed ?Have had blistering sunburns, especially during childhood ?Have a close family member who has had a melanoma ?Have atypical moles or large birthmarks ? ?Early detection of melanoma is key since treatment is typically straightforward and cure rates are extremely high if we catch it early.  ? ?The first sign of melanoma is often a change in a mole or a new dark spot.  The ABCDE system is a way of remembering the signs of melanoma. ? ?A for asymmetry:  The two halves do not match. ?B for border:  The edges of the growth are irregular. ?C for color:  A mixture of colors are present instead of an even brown color. ?D for diameter:  Melanomas are usually (but not always) greater than 67m - the size of a pencil eraser. ?E for evolution:  The spot keeps changing in size, shape, and color. ? ?Please check your skin once per month between visits. You can use a small mirror in front and a large mirror behind you to keep an eye on the back side or your body.  ? ?If you see any new or changing lesions before your next follow-up, please call to schedule a visit. ? ?Please continue daily skin protection including broad spectrum sunscreen SPF 30+ to sun-exposed areas, reapplying every 2 hours as needed when you're outdoors.  ? ?Staying in the shade or wearing long sleeves, sun glasses (UVA+UVB protection) and wide brim hats (4-inch brim around the entire circumference of the hat) are also recommended for sun protection.   ? ?If You Need Anything After Your Visit ? ?If you have any questions  or concerns for your doctor, please call our main line at [phone] press option 4 to reach your doctor's medical assistant. If no one answers, please leave a voicemail as directed and we will return your call as soon as possible. Messages left after 4 pm will be answered the following business day.  ? ?You may also send [place] message via MyChart. We typically respond to MyChart messages within 1-2 business days. ? ?For prescription refills, please ask your pharmacy to contact our office. Our fax number is [phone] ? ?If you have an urgent issue when the clinic is closed that cannot wait until the next business day, you can page your doctor at the number below.   ? ?Please note that while we do our best to be available for urgent issues outside of office hours, we are not available 24/7.  ? ?If you have an urgent issue and are unable to reach [place] you may choose to seek medical care at your doctor's office, retail clinic, urgent care center, or emergency room. ? ?If you have a medical emergency, please immediately call 911 or go to the emergency department. ? ?Pager Numbers ? ?- Dr. KNehemiah Massed [phone]? ?- Dr. MLaurence Ferrari [phone]? ?- Dr. SNicole Kindred [phone]? ?In the event of inclement weather, please call our main line at [phone] an update on the status of any delays or closures. ? ?  Dermatology Medication Tips: ?Please keep the boxes that topical medications come in in order to help keep track of the instructions about where and how to use these. Pharmacies typically print the medication instructions only on the boxes and not directly on the medication tubes.  ? ?If your medication is too expensive, please contact our office at [phone] option 4 or send [place] a message through [place].  ? ?We are unable to tell what your co-pay for medications will be in advance as this is different depending on your insurance coverage. However, we may be able to find a substitute medication at lower cost  or fill out paperwork to get insurance to cover a needed medication.  ? ?If a prior authorization is required to get your medication covered by your insurance company, please allow [place] 1-2 business days to complete this process. ? ?Drug prices often vary depending on where the prescription is filled and some pharmacies may offer cheaper prices. ? ?The website www.goodrx.com contains coupons for medications through different pharmacies. The prices here do not account for what the cost may be with help from insurance (it may be cheaper with your insurance), but the website can give you the price if you did not use any insurance.  ?- You can print the associated coupon and take it with your prescription to the pharmacy.  ?- You may also stop by our office during regular business hours and pick up a GoodRx coupon card.  ?- If you need your prescription sent electronically to a different pharmacy, notify our office through [facility] or by phone at [phone] option 4. ? ? ? ? ?Si Usted Necesita Algo Despu?s de Su Visita ? ?Tambi?n puede enviarnos un mensaje a trav?s de MyChart. Por lo general respondemos a los mensajes de MyChart en el transcurso de 1 a 2 d?as h?biles. ? ?Para renovar recetas, por favor pida a su farmacia que se ponga en contacto con nuestra oficina. Nuestro n?mero de fax es el [phone]. ? ?Si tiene un asunto urgente cuando la cl?nica est? cerrada y que no puede esperar hasta el siguiente d?a h?bil, puede llamar/localizar a su doctor(a) al n?mero que aparece a continuaci?n.  ? ?Por favor, tenga en cuenta que aunque hacemos todo lo posible para estar disponibles para asuntos urgentes fuera del horario de oficina, no estamos disponibles las 24 horas del d?a, los 7 d?as de la semana.  ? ?Si tiene un problema urgente y no puede comunicarse con nosotros, puede optar por buscar atenci?n m?dica  en el consultorio de su doctor(a), en una cl?nica privada, en un centro de atenci?n urgente o en una  sala de emergencias. ? ?Si tiene Engineer, maintenance (IT) m?dica, por favor llame inmediatamente al 911 o vaya a la sala de emergencias. ? ?N?meros de b?per ? ?- Dr. Nehemiah Massed: [phone] ? ?- Dra. Moye: [phone] ? ?- Dra. Nicole Kindred: [phone] ? ?En caso de inclemencias del tiempo, por favor llame a nuestra l?nea principal al [phone] para una actualizaci?n sobre el estado de cualquier retraso o cierre. ? ?Consejos para la medicaci?n en dermatolog?a: ?Por favor, guarde las cajas en las que vienen los medicamentos de uso t?pico para ayudarle a seguir las instrucciones sobre d?nde y c?mo usarlos. Las farmacias generalmente imprimen las instrucciones del medicamento s?lo en las cajas y no directamente en los tubos del [place].  ? ?Si su medicamento es muy caro, por favor, p?ngase en contacto con Zigmund Daniel llamando al [phone] y presione la opci?n 4 o env?enos un  mensaje a trav?s de MyChart.  ? ?No podemos decirle cu?l ser? su copago por los medicamentos por adelantado ya que esto es diferente dependiendo de la cobertura de su seguro. Sin embargo, es posible que podamos encontrar un medicamento sustituto a Electrical engineer un formulario para que el seguro cubra el medicamento que se considera necesario.  ? ?Si se requiere Ardelia Mems autorizaci?n previa para que su compa??a de seguros [place] su medicamento, por favor perm?tanos de 1 a 2 d?as h?biles para completar este proceso. ? ?Los precios de los medicamentos var?an con frecuencia dependiendo del Environmental consultant de d?nde se surte la receta y alguna farmacias pueden ofrecer precios m?s baratos. ? ?El sitio web www.goodrx.com tiene cupones para medicamentos de Airline pilot. Los precios aqu? no tienen en cuenta lo que podr?a costar con la ayuda del seguro (puede ser m?s barato con su seguro), pero el sitio web puede darle el precio si no utiliz? ning?n seguro.  ?- Puede imprimir el cup?n correspondiente y llevarlo con su receta a la farmacia.  ?- Tambi?n puede  pasar por nuestra oficina durante el horario de atenci?n regular y recoger una tarjeta de cupones de GoodRx.  ?- Si necesita que su receta se env?e electr?nicamente a una farmacia diferente, informe a nu

## 2022-04-19 ENCOUNTER — Other Ambulatory Visit: Payer: Self-pay | Admitting: Nurse Practitioner

## 2022-04-19 DIAGNOSIS — R1032 Left lower quadrant pain: Secondary | ICD-10-CM

## 2022-04-19 DIAGNOSIS — R1084 Generalized abdominal pain: Secondary | ICD-10-CM

## 2022-04-19 DIAGNOSIS — R197 Diarrhea, unspecified: Secondary | ICD-10-CM

## 2022-04-26 ENCOUNTER — Ambulatory Visit
Admission: RE | Admit: 2022-04-26 | Discharge: 2022-04-26 | Disposition: A | Discharge status: Home / Self Care | Payer: Medicare Other | Source: Ambulatory Visit | Attending: Nurse Practitioner | Admitting: Nurse Practitioner

## 2022-04-26 DIAGNOSIS — R197 Diarrhea, unspecified: Secondary | ICD-10-CM | POA: Insufficient documentation

## 2022-04-26 DIAGNOSIS — R1031 Right lower quadrant pain: Secondary | ICD-10-CM | POA: Diagnosis present

## 2022-04-26 DIAGNOSIS — R1084 Generalized abdominal pain: Secondary | ICD-10-CM | POA: Diagnosis present

## 2022-04-26 DIAGNOSIS — R1032 Left lower quadrant pain: Secondary | ICD-10-CM | POA: Insufficient documentation

## 2022-05-09 ENCOUNTER — Encounter (INDEPENDENT_AMBULATORY_CARE_PROVIDER_SITE_OTHER): Payer: Self-pay | Admitting: Vascular Surgery

## 2022-05-09 ENCOUNTER — Ambulatory Visit (INDEPENDENT_AMBULATORY_CARE_PROVIDER_SITE_OTHER): Payer: Medicare Other | Admitting: Vascular Surgery

## 2022-05-09 DIAGNOSIS — I480 Paroxysmal atrial fibrillation: Secondary | ICD-10-CM | POA: Insufficient documentation

## 2022-05-09 DIAGNOSIS — I771 Stricture of artery: Secondary | ICD-10-CM

## 2022-05-09 DIAGNOSIS — I4891 Unspecified atrial fibrillation: Secondary | ICD-10-CM | POA: Diagnosis not present

## 2022-05-09 NOTE — Assessment & Plan Note (Signed)
On anticoagulation and rate controlled 

## 2022-05-09 NOTE — Assessment & Plan Note (Signed)
She underwent an ultrasound at the hospital which suggested velocities in the celiac artery that would represent a greater than 70% stenosis with patency of the SMA and IMA.  We had a long discussion today about the pathophysiology and natural history of celiac artery stenosis.  We also discussed that most people with isolated celiac artery stenosis are asymptomatic.  Given her small frame and the symptoms she has, I think the more likely problem for her would be a median arcuate ligament compression which would be of debatable importance and an 88 year old female.  She does not have classic chronic visceral ischemia symptoms, but symptoms can be very nebulous for this.  We discussed that treatment may or may not improve her symptoms but it can certainly be considered.  I would recommend a CT angiogram be done first to help evaluate for extrinsic compression versus vascular disease.  If this is a median arcuate ligament syndrome, I do not treat those and it would likely be a general surgery referral for release.  Given her age and the nebulous symptoms, that may be something we should consider strongly.  If this appears to be an atherosclerotic issue, consideration for angioplasty and stenting would be less invasive and would be worth pursuing. I will see her after the CT scan

## 2022-05-09 NOTE — Progress Notes (Signed)
Patient ID: Laurie Shields, female   DOB: 1932-10-18, 88 y.o.   MRN: [id number]  Chief Complaint  Patient presents with   Establish Care    Referred by Dr mills    HPI Laurie Shields is a 88 y.o. female.  I am asked to see the patient by Laurie Shields for evaluation of celiac artery stenosis.  The patient reports crampy abdominal pain that is worse after eating frequently.  This has been a progressive problem over months and years.  She denies any unintentional weight loss.  She does not really have food fear.  She denies any common vomiting but does have nausea.  She has frequent diarrhea and has been told she has irritable bowel.  She underwent an ultrasound at the hospital which suggested velocities in the celiac artery that would represent a greater than 70% stenosis with patency of the SMA and IMA.  Given this ultrasound finding, she is referred for further evaluation and treatment.     Past Medical History:  Diagnosis Date   Atrial fibrillation ([place])    Cancer ([place])    melanoma   Melanoma ([place])    right ankle   Melanoma in situ ([place]) bx 08/03/20   right upper forearm, EXC 09/08/2020, margins free   SCC (squamous cell carcinoma) 08/11/2021   R pretibia, MOHs completed 09/22/2021   SCC (squamous cell carcinoma) 08/11/2021   R upper back/base of neck, EDC 09/07/2021   Squamous cell carcinoma of skin 03/11/2019   right mid pretibia (tx with [facility] 7/20)    No past surgical history on file.   Family History  Problem Relation Age of Onset   Breast cancer Neg Hx   No bleeding or clotting disorders No aneurysms   Social History   Tobacco Use   Smoking status: Never   Smokeless tobacco: Never  Substance Use Topics   Alcohol use: Not Currently   Drug use: Not Currently     Allergies  Allergen Reactions   Trazodone Other (See Comments) and Nausea Only    Blurred vision Other reaction(s): Other (See Comments) Blurred vision    Current Outpatient Medications  Medication  Sig Dispense Refill   Cholecalciferol (VITAMIN D3) 50 MCG (2000 UT) capsule Take 2,000 Units by mouth daily.     co-enzyme Q-10 30 MG capsule Take 30 mg by mouth daily.      diltiazem (DILACOR XR) 240 MG 24 hr capsule Take 240 mg by mouth daily.      ELIQUIS 2.5 MG TABS tablet Take 2.5 mg by mouth daily.     metoprolol tartrate (LOPRESSOR) 25 MG tablet Take 25 mg by mouth daily.     Multiple Vitamin (MULTIVITAMIN) tablet Take 1 tablet by mouth daily.     prednisoLONE acetate (PRED FORTE) 1 % ophthalmic suspension Place 1 drop into the left eye 2 (two) times daily.     No current facility-administered medications for this visit.      REVIEW OF SYSTEMS (Negative unless checked)  Constitutional: '[]'$ Weight loss  '[]'$ Fever  '[]'$ Chills Cardiac: '[]'$ Chest pain   '[]'$ Chest pressure   '[x]'$ Palpitations   '[]'$ Shortness of breath when laying flat   '[]'$ Shortness of breath at rest   '[]'$ Shortness of breath with exertion. Vascular:  '[]'$ Pain in legs with walking   '[]'$ Pain in legs at rest   '[]'$ Pain in legs when laying flat   '[]'$ Claudication   '[]'$ Pain in feet when walking  '[]'$ Pain in feet at rest  '[]'$ Pain in feet when laying flat   '[]'$   History of DVT   '[]'$ Phlebitis   '[]'$ Swelling in legs   '[]'$ Varicose veins   '[]'$ Non-healing ulcers Pulmonary:   '[]'$ Uses home oxygen   '[]'$ Productive cough   '[]'$ Hemoptysis   '[]'$ Wheeze  '[]'$ COPD   '[]'$ Asthma Neurologic:  '[]'$ Dizziness  '[]'$ Blackouts   '[]'$ Seizures   '[]'$ History of stroke   '[]'$ History of TIA  '[]'$ Aphasia   '[]'$ Temporary blindness   '[]'$ Dysphagia   '[]'$ Weakness or numbness in arms   '[]'$ Weakness or numbness in legs Musculoskeletal:  '[x]'$ Arthritis   '[]'$ Joint swelling   '[]'$ Joint pain   '[]'$ Low back pain Hematologic:  '[]'$ Easy bruising  '[]'$ Easy bleeding   '[]'$ Hypercoagulable state   '[]'$ Anemic  '[]'$ Hepatitis Gastrointestinal:  '[]'$ Blood in stool   '[]'$ Vomiting blood  '[]'$ Gastroesophageal reflux/heartburn   '[x]'$ Abdominal pain Genitourinary:  '[]'$ Chronic kidney disease   '[]'$ Difficult urination  '[]'$ Frequent urination  '[]'$ Burning with urination    '[]'$ Hematuria Skin:  '[]'$ Rashes   '[]'$ Ulcers   '[]'$ Wounds Psychological:  '[]'$ History of anxiety   '[]'$  History of major depression.    Physical Exam BP 127/68 (BP Location: Right Arm)   Pulse 70   Resp 18   Ht '5\' 4"'$  (1.626 m)   Wt 122 lb (55.3 kg)   BMI 20.94 kg/m  Gen:  WD/WN, NAD.  Appears younger than stated age Head: [place]/AT, No temporalis wasting.  Ear/Nose/Throat: Hearing grossly intact, nares w/o erythema or drainage, oropharynx w/o Erythema/Exudate Eyes: Conjunctiva clear, sclera non-icteric.   Neck: trachea midline.  No JVD.  Pulmonary:  Good air movement, respirations not labored, no use of accessory muscles  Cardiac: RRR, no JVD Vascular:  Vessel Right Left  Radial Palpable Palpable                                   Gastrointestinal:. No masses, surgical incisions, or scars. Musculoskeletal: M/S 5/5 throughout.  Extremities without ischemic changes.  No deformity or atrophy.  No edema. Neurologic: Sensation grossly intact in extremities.  Symmetrical.  Speech is fluent. Motor exam as listed above. Psychiatric: Judgment intact, Mood & affect appropriate for pt's clinical situation.  Somewhat anxious. Dermatologic: No rashes or ulcers noted.  No cellulitis or open wounds.    Radiology [place] MESENTERIC ARTERIES  Result Date: 04/26/2022 CLINICAL DATA:  Abdominal cramping EXAM: [place] MESENTERIC ARTERIAL DOPPLER COMPARISON:  None Available. FINDINGS: Celiac axis: 341 cm/sec Celiac axis with inspiration: 327 cm/sec Celiac axis with expiration: 277 cm/sec Splenic artery: 121 cm/sec Hepatic artery: 66 cm/sec SMA: 169 cm/sec IMA: 55 cm/sec Aorta: 74 cm/sec Aortic size: 2.1 cm proximally, 1.6 cm in the mid segment and 1.6 cm distally IMPRESSION: 1. Elevated peak systolic velocity in the proximal celiac artery consistent with a greater than 70% diameter stenosis. 2. No significant stenosis identified at the origins of the SMA or IMA. 3. No evidence of aortic aneurysm. Signed, Criselda Peaches, MD, [place] Radiology Electronically Signed   By: Jacqulynn Cadet M.D.   On: 04/26/2022 16:02    Labs No results found for this or any previous visit (from the past 2160 hour(s)).  Assessment/Plan:  Celiac artery stenosis (HCC) She underwent an ultrasound at the hospital which suggested velocities in the celiac artery that would represent a greater than 70% stenosis with patency of the SMA and IMA.  We had a long discussion today about the pathophysiology and natural history of celiac artery stenosis.  We also discussed that most people with isolated celiac  artery stenosis are asymptomatic.  Given her small frame and the symptoms she has, I think the more likely problem for her would be a median arcuate ligament compression which would be of debatable importance and an 88 year old female.  She does not have classic chronic visceral ischemia symptoms, but symptoms can be very nebulous for this.  We discussed that treatment may or may not improve her symptoms but it can certainly be considered.  I would recommend a CT angiogram be done first to help evaluate for extrinsic compression versus vascular disease.  If this is a median arcuate ligament syndrome, I do not treat those and it would likely be a general surgery referral for release.  Given her age and the nebulous symptoms, that may be something we should consider strongly.  If this appears to be an atherosclerotic issue, consideration for angioplasty and stenting would be less invasive and would be worth pursuing. I will see her after the CT scan  Atrial fibrillation ([place]) On anticoagulation and rate controlled      Leotis Pain 05/09/2022, 10:45 AM   This note was created with Dragon medical transcription system.  Any errors from dictation are unintentional.

## 2022-05-11 ENCOUNTER — Telehealth (INDEPENDENT_AMBULATORY_CARE_PROVIDER_SITE_OTHER): Payer: Self-pay | Admitting: Vascular Surgery

## 2022-05-11 NOTE — Telephone Encounter (Signed)
LVM for pt to call Radiology scheduling and set up CT that Dr. Lucky Cowboy ordered. There is no prior auth req so she is able to set it up now. I left # of [phone] and advised pt to call back to our office to make a CT results appt with Dr. Lucky Cowboy.

## 2022-05-19 ENCOUNTER — Ambulatory Visit
Admission: RE | Admit: 2022-05-19 | Discharge: 2022-05-19 | Disposition: A | Discharge status: Home / Self Care | Payer: Medicare Other | Source: Ambulatory Visit | Attending: Vascular Surgery | Admitting: Vascular Surgery

## 2022-05-19 DIAGNOSIS — I771 Stricture of artery: Secondary | ICD-10-CM | POA: Insufficient documentation

## 2022-05-19 MED ORDER — IOHEXOL 350 MG/ML SOLN
75.0000 mL | Freq: Once | INTRAVENOUS | Status: AC | PRN
  Administered 2022-05-19: 75 mL via INTRAVENOUS

## 2022-05-23 ENCOUNTER — Encounter (INDEPENDENT_AMBULATORY_CARE_PROVIDER_SITE_OTHER): Payer: Self-pay | Admitting: Vascular Surgery

## 2022-05-23 ENCOUNTER — Ambulatory Visit (INDEPENDENT_AMBULATORY_CARE_PROVIDER_SITE_OTHER): Payer: Medicare Other | Admitting: Vascular Surgery

## 2022-05-23 VITALS — BP 125/67 | HR 61 | Resp 16 | Wt 124.0 lb

## 2022-05-23 DIAGNOSIS — I7410 Embolism and thrombosis of unspecified parts of aorta: Secondary | ICD-10-CM | POA: Diagnosis not present

## 2022-05-23 DIAGNOSIS — I4891 Unspecified atrial fibrillation: Secondary | ICD-10-CM

## 2022-05-23 DIAGNOSIS — I701 Atherosclerosis of renal artery: Secondary | ICD-10-CM

## 2022-05-23 DIAGNOSIS — I771 Stricture of artery: Secondary | ICD-10-CM | POA: Diagnosis not present

## 2022-05-23 NOTE — Progress Notes (Signed)
MRN : [id number]  Laurie Shields is a 88 y.o. (December 19, 1932) female who presents with chief complaint of  Chief Complaint  Patient presents with   Follow-up    Ct results  .  History of Present Illness: Patient returns today in follow up after her CT angiogram which I have independently reviewed.  This was done for a mesenteric duplex performed another facility which suggested high-grade celiac artery stenosis.  Her CTA does not demonstrate any significant celiac artery stenosis, SMA stenosis, or IMA disease.  There is some mild narrowing of the proximal celiac artery but this does not appear to be significant.  Her symptoms are basically unchanged.  Incidental findings on the CT scan also included significant mural thrombus in the descending thoracic aorta as well as some degree of renal artery stenosis bilaterally that did not appear to be greater than 60%  Current Outpatient Medications  Medication Sig Dispense Refill   Cholecalciferol (VITAMIN D3) 50 MCG (2000 UT) capsule Take 2,000 Units by mouth daily.     co-enzyme Q-10 30 MG capsule Take 30 mg by mouth daily.      dicyclomine (BENTYL) 10 MG capsule Take 10 mg by mouth 4 (four) times daily -  before meals and at bedtime.     diltiazem (DILACOR XR) 240 MG 24 hr capsule Take 240 mg by mouth daily.      ELIQUIS 2.5 MG TABS tablet Take 2.5 mg by mouth daily.     metoprolol tartrate (LOPRESSOR) 25 MG tablet Take 25 mg by mouth daily.     Multiple Vitamin (MULTIVITAMIN) tablet Take 1 tablet by mouth daily.     prednisoLONE acetate (PRED FORTE) 1 % ophthalmic suspension Place 1 drop into the left eye 2 (two) times daily.     No current facility-administered medications for this visit.    Past Medical History:  Diagnosis Date   Atrial fibrillation ([place])    Cancer ([place])    melanoma   Melanoma ([place])    right ankle   Melanoma in situ ([place]) bx 08/03/20   right upper forearm, EXC 09/08/2020, margins free   SCC (squamous cell carcinoma)  08/11/2021   R pretibia, MOHs completed 09/22/2021   SCC (squamous cell carcinoma) 08/11/2021   R upper back/base of neck, EDC 09/07/2021   Squamous cell carcinoma of skin 03/11/2019   right mid pretibia (tx with [facility] 7/20)    Past Surgical History:  Procedure Laterality Date   CHOLECYSTECTOMY     MELANOMA EXCISION     VULVECTOMY       Social History   Tobacco Use   Smoking status: Never   Smokeless tobacco: Never  Substance Use Topics   Alcohol use: Not Currently   Drug use: Not Currently  NO IVDU    Family History  Problem Relation Age of Onset   Cancer Mother    Heart attack Mother    Alzheimer's disease Father    Breast cancer Neg Hx   No bleeding or clotting disorders No aneurysms   Allergies  Allergen Reactions   Trazodone Other (See Comments) and Nausea Only    Blurred vision Other reaction(s): Other (See Comments) Blurred vision     REVIEW OF SYSTEMS (Negative unless checked)  Constitutional: '[]'$ Weight loss  '[]'$ Fever  '[]'$ Chills Cardiac: '[]'$ Chest pain   '[]'$ Chest pressure   '[]'$ Palpitations   '[]'$ Shortness of breath when laying flat   '[]'$ Shortness of breath at rest   '[]'$ Shortness of breath with exertion. Vascular:  '[]'$ Pain  in legs with walking   '[]'$ Pain in legs at rest   '[]'$ Pain in legs when laying flat   '[]'$ Claudication   '[]'$ Pain in feet when walking  '[]'$ Pain in feet at rest  '[]'$ Pain in feet when laying flat   '[]'$ History of DVT   '[]'$ Phlebitis   '[]'$ Swelling in legs   '[]'$ Varicose veins   '[]'$ Non-healing ulcers Pulmonary:   '[]'$ Uses home oxygen   '[]'$ Productive cough   '[]'$ Hemoptysis   '[]'$ Wheeze  '[]'$ COPD   '[]'$ Asthma Neurologic:  '[]'$ Dizziness  '[]'$ Blackouts   '[]'$ Seizures   '[]'$ History of stroke   '[]'$ History of TIA  '[]'$ Aphasia   '[]'$ Temporary blindness   '[]'$ Dysphagia   '[]'$ Weakness or numbness in arms   '[]'$ Weakness or numbness in legs Musculoskeletal:  '[x]'$ Arthritis   '[]'$ Joint swelling   '[]'$ Joint pain   '[]'$ Low back pain Hematologic:  '[]'$ Easy bruising  '[]'$ Easy bleeding   '[]'$ Hypercoagulable state   '[]'$ Anemic    Gastrointestinal:  '[]'$ Blood in stool   '[]'$ Vomiting blood  '[]'$ Gastroesophageal reflux/heartburn   '[x]'$ Abdominal pain Genitourinary:  '[]'$ Chronic kidney disease   '[]'$ Difficult urination  '[]'$ Frequent urination  '[]'$ Burning with urination   '[]'$ Hematuria Skin:  '[]'$ Rashes   '[]'$ Ulcers   '[]'$ Wounds Psychological:  '[]'$ History of anxiety   '[]'$  History of major depression.  Physical Examination  BP 125/67 (BP Location: Right Arm)   Pulse 61   Resp 16   Wt 124 lb (56.2 kg)   BMI 21.28 kg/m  Gen:  WD/WN, NAD. Appears younger than stated age. Head: [place]/AT, No temporalis wasting. Ear/Nose/Throat: Hearing grossly intact, nares w/o erythema or drainage Eyes: Conjunctiva clear. Sclera non-icteric Neck: Supple.  Trachea midline Pulmonary:  Good air movement, no use of accessory muscles.  Cardiac: RRR, no JVD Vascular:  Vessel Right Left  Radial Palpable Palpable       Musculoskeletal: M/S 5/5 throughout.  No deformity or atrophy. No edema. Neurologic: Sensation grossly intact in extremities.  Symmetrical.  Speech is fluent.  Psychiatric: Judgment intact, Mood & affect appropriate for pt's clinical situation. Dermatologic: No rashes or ulcers noted.  No cellulitis or open wounds.      Labs No results found for this or any previous visit (from the past 2160 hour(s)).  Radiology CT Angio Abd/Pel w/ and/or w/o  Result Date: 05/20/2022 CLINICAL DATA:  Chronic mesenteric ischemia EXAM: CTA ABDOMEN AND PELVIS WITHOUT AND WITH CONTRAST TECHNIQUE: Multidetector CT imaging of the abdomen and pelvis was performed using the standard protocol during bolus administration of intravenous contrast. Multiplanar reconstructed images and MIPs were obtained and reviewed to evaluate the vascular anatomy. RADIATION DOSE REDUCTION: This exam was performed according to the departmental dose-optimization program which includes automated exposure control, adjustment of the mA and/or kV according to patient size and/or use of iterative  reconstruction technique. CONTRAST:  60m OMNIPAQUE IOHEXOL 350 MG/ML SOLN COMPARISON:  CT scan abdomen and pelvis 06/29/2019 FINDINGS: VASCULAR Aorta: Highly irregular ulcerated atherosclerotic plaque with a small tongue of wall adherent mural thrombus visible in the distal descending thoracic aorta. No evidence of dissection or aneurysm. The visceral and infrarenal abdominal aorta demonstrate more smooth atherosclerotic plaque. Celiac: Patent without evidence of aneurysm, dissection, vasculitis or significant stenosis. The left hepatic artery is replaced to the left gastric artery. SMA: Patent without evidence of aneurysm, dissection, vasculitis or significant stenosis. Renals: Solitary renal arteries bilaterally. Fibrofatty atherosclerotic plaque results in mild stenosis of the origin of the right renal artery. Predominantly calcified atherosclerotic plaque results in moderate stenosis of the origin of the left renal artery. IMA: Patent  without evidence of aneurysm, dissection, vasculitis or significant stenosis. Inflow: Patent without evidence of aneurysm, dissection, vasculitis or significant stenosis. Proximal Outflow: Bilateral common femoral and visualized portions of the superficial and profunda femoral arteries are patent without evidence of aneurysm, dissection, vasculitis or significant stenosis. Veins: Widely patent hepatic and portal veins. The renal veins, splenic veins, IVC and iliac veins also all appear patent. No focal abnormality. Review of the MIP images confirms the above findings. NON-VASCULAR Lower chest: No acute abnormality. Hepatobiliary: Stable simple cyst in the central right liver the without change dating back to October of 2020. The gallbladder is surgically absent. No biliary ductal dilatation. Pancreas: Unremarkable. No pancreatic ductal dilatation or surrounding inflammatory changes. Spleen: Normal in size without focal abnormality. Adrenals/Urinary Tract: The adrenal glands are  normal. No hydronephrosis or nephrolithiasis. The right kidney is normal. There is a small intermediate attenuation lesion exophytic from the interpolar aspect of the left kidney which measures approximately 7 mm. This creates a contour defect which is readily visible. Comparison with prior unenhanced CT scan from October of 2020 demonstrates no such lesion. This is concerning for a small developing renal cell carcinoma. The ureters and bladder are unremarkable. Stomach/Bowel: Severe diverticulosis of the sigmoid colon. No definite wall thickening or inflammatory changes to suggest active diverticulitis. More mild diverticulosis is also present throughout the rest of the colon. The terminal ileum is unremarkable. The appendix is normal. No focal bowel wall thickening or obstruction. Lymphatic: No suspicious lymphadenopathy. Reproductive: Uterus and bilateral adnexa are unremarkable. Other: No abdominal wall hernia or abnormality. No abdominopelvic ascites. Musculoskeletal: Bilateral L5 pars defects with grade 1 anterolisthesis of L5 on S1 measuring approximately 1 cm. There is associated focal degenerative disc disease as well. No fracture or suspicious bony lesion. The bones appear demineralized. IMPRESSION: VASCULAR 1. Extremely irregular and ulcerated atherosclerotic plaque in the distal descending thoracic aorta likely with a small tongue of wall adherent mural thrombus. This site poses a theoretical risk for distal embolization or micro embolization. 2. However, no evidence of distal embolization is present at this time. The mesenteric arteries are widely patent in there is no evidence of acute or subacute infarcts in the spleen or kidneys. 3. No significant stenosis of the mesenteric arteries. 4. Mild right and moderate left renal artery stenosis. NON-VASCULAR 1. Small 7 mm intermediate density lesion exophytic from the interpolar left kidney is new compared to prior imaging from October of 2020 and concerning  for a developing renal cell carcinoma. Recommend follow-up with repeat renal protocol CT scan or MRI in 6 months to assess for growth. 2. Advanced colonic diverticulosis without evidence of active diverticulitis. 3. Bilateral L5 pars defects with associated degenerative disc disease and grade 1 anterolisthesis of L5 on S1. 4. Additional ancillary findings as above. Aortic Atherosclerosis (ICD10-I70.0). Electronically Signed   By: Jacqulynn Cadet M.D.   On: 05/20/2022 06:29   [place] MESENTERIC ARTERIES  Result Date: 04/26/2022 CLINICAL DATA:  Abdominal cramping EXAM: [place] MESENTERIC ARTERIAL DOPPLER COMPARISON:  None Available. FINDINGS: Celiac axis: 341 cm/sec Celiac axis with inspiration: 327 cm/sec Celiac axis with expiration: 277 cm/sec Splenic artery: 121 cm/sec Hepatic artery: 66 cm/sec SMA: 169 cm/sec IMA: 55 cm/sec Aorta: 74 cm/sec Aortic size: 2.1 cm proximally, 1.6 cm in the mid segment and 1.6 cm distally IMPRESSION: 1. Elevated peak systolic velocity in the proximal celiac artery consistent with a greater than 70% diameter stenosis. 2. No significant stenosis identified at the origins of the SMA or IMA. 3.  No evidence of aortic aneurysm. Signed, Criselda Peaches, MD, [place] [facility] Radiology Electronically Signed   By: Jacqulynn Cadet M.D.   On: 04/26/2022 16:02    Assessment/Plan Atrial fibrillation (HCC) On anticoagulation and rate controlled  Celiac artery stenosis ([place]) CTA reviewed.  There is some mild celiac artery stenosis but is not hemodynamically significant and her SMA and IMA are patent as well.  The duplex likely overestimated the degree of stenosis based on some tortuosity and possible extrinsic compression but this is not significant.  No further work-up planned.  Aortic mural thrombus (HCC) CT angiogram reviewed she does have some distal descending thoracic aortic mural thrombus which is moderate in nature.  She is already  on anticoagulation and is not really symptomatic from this.  No distal embolization appears present.  Would continue anticoagulation and no further work-up planned.  Renal artery stenosis (HCC) Her CT angiogram does demonstrate some degree of renal artery stenosis although neither renal artery appears to be greater than 60% per my review.  She has generally not had any major issues with hypertension and her renal function is fine as far she knows.  No intervention will be required for this but this would be something we would want to follow and I will plan to see her back in 6 months with a duplex of the renal arteries.    Leotis Pain, MD  05/23/2022 12:24 PM    This note was created with Dragon medical transcription system.  Any errors from dictation are purely unintentional

## 2022-05-23 NOTE — Assessment & Plan Note (Signed)
Her CT angiogram does demonstrate some degree of renal artery stenosis although neither renal artery appears to be greater than 60% per my review.  She has generally not had any major issues with hypertension and her renal function is fine as far she knows.  No intervention will be required for this but this would be something we would want to follow and I will plan to see her back in 6 months with a duplex of the renal arteries.

## 2022-05-23 NOTE — Assessment & Plan Note (Signed)
CTA reviewed.  There is some mild celiac artery stenosis but is not hemodynamically significant and her SMA and IMA are patent as well.  The duplex likely overestimated the degree of stenosis based on some tortuosity and possible extrinsic compression but this is not significant.  No further work-up planned.

## 2022-05-23 NOTE — Assessment & Plan Note (Signed)
CT angiogram reviewed she does have some distal descending thoracic aortic mural thrombus which is moderate in nature.  She is already on anticoagulation and is not really symptomatic from this.  No distal embolization appears present.  Would continue anticoagulation and no further work-up planned.

## 2022-05-31 ENCOUNTER — Ambulatory Visit: Payer: Medicare Other | Admitting: Dermatology

## 2022-05-31 ENCOUNTER — Encounter: Payer: Self-pay | Admitting: Dermatology

## 2022-05-31 DIAGNOSIS — Z86006 Personal history of melanoma in-situ: Secondary | ICD-10-CM

## 2022-05-31 DIAGNOSIS — L821 Other seborrheic keratosis: Secondary | ICD-10-CM

## 2022-05-31 DIAGNOSIS — D692 Other nonthrombocytopenic purpura: Secondary | ICD-10-CM

## 2022-05-31 DIAGNOSIS — Z85820 Personal history of malignant melanoma of skin: Secondary | ICD-10-CM

## 2022-05-31 DIAGNOSIS — L814 Other melanin hyperpigmentation: Secondary | ICD-10-CM

## 2022-05-31 DIAGNOSIS — L820 Inflamed seborrheic keratosis: Secondary | ICD-10-CM

## 2022-05-31 DIAGNOSIS — Z85828 Personal history of other malignant neoplasm of skin: Secondary | ICD-10-CM | POA: Diagnosis not present

## 2022-05-31 DIAGNOSIS — L578 Other skin changes due to chronic exposure to nonionizing radiation: Secondary | ICD-10-CM

## 2022-05-31 DIAGNOSIS — D225 Melanocytic nevi of trunk: Secondary | ICD-10-CM

## 2022-05-31 DIAGNOSIS — D1801 Hemangioma of skin and subcutaneous tissue: Secondary | ICD-10-CM

## 2022-05-31 DIAGNOSIS — D229 Melanocytic nevi, unspecified: Secondary | ICD-10-CM

## 2022-05-31 DIAGNOSIS — Z1283 Encounter for screening for malignant neoplasm of skin: Secondary | ICD-10-CM | POA: Diagnosis not present

## 2022-05-31 NOTE — Patient Instructions (Signed)
Cryotherapy Aftercare  Wash gently with soap and water everyday.   Apply Vaseline daily until healed.    Recommend daily broad spectrum sunscreen SPF 30+ to sun-exposed areas, reapply every 2 hours as needed. Call for new or changing lesions.  Staying in the shade or wearing long sleeves, sun glasses (UVA+UVB protection) and wide brim hats (4-inch brim around the entire circumference of the hat) are also recommended for sun protection.    Melanoma ABCDEs  Melanoma is the most dangerous type of skin cancer, and is the leading cause of death from skin disease.  You are more likely to develop melanoma if you: Have light-colored skin, light-colored eyes, or red or blond hair Spend a lot of time in the sun Tan regularly, either outdoors or in a tanning bed Have had blistering sunburns, especially during childhood Have a close family member who has had a melanoma Have atypical moles or large birthmarks  Early detection of melanoma is key since treatment is typically straightforward and cure rates are extremely high if we catch it early.   The first sign of melanoma is often a change in a mole or a new dark spot.  The ABCDE system is a way of remembering the signs of melanoma.  A for asymmetry:  The two halves do not match. B for border:  The edges of the growth are irregular. C for color:  A mixture of colors are present instead of an even brown color. D for diameter:  Melanomas are usually (but not always) greater than 6mm - the size of a pencil eraser. E for evolution:  The spot keeps changing in size, shape, and color.  Please check your skin once per month between visits. You can use a small mirror in front and a large mirror behind you to keep an eye on the back side or your body.   If you see any new or changing lesions before your next follow-up, please call to schedule a visit.  Please continue daily skin protection including broad spectrum sunscreen SPF 30+ to sun-exposed areas,  reapplying every 2 hours as needed when you're outdoors.   Staying in the shade or wearing long sleeves, sun glasses (UVA+UVB protection) and wide brim hats (4-inch brim around the entire circumference of the hat) are also recommended for sun protection.       Due to recent changes in healthcare laws, you may see results of your pathology and/or laboratory studies on MyChart before the doctors have had a chance to review them. We understand that in some cases there may be results that are confusing or concerning to you. Please understand that not all results are received at the same time and often the doctors may need to interpret multiple results in order to provide you with the best plan of care or course of treatment. Therefore, we ask that you please give us 2 business days to thoroughly review all your results before contacting the office for clarification. Should we see a critical lab result, you will be contacted sooner.   If You Need Anything After Your Visit  If you have any questions or concerns for your doctor, please call our main line at [phone] and press option 4 to reach your doctor's medical assistant. If no one answers, please leave a voicemail as directed and we will return your call as soon as possible. Messages left after 4 pm will be answered the following business day.   You may also send us a message   via MyChart. We typically respond to MyChart messages within 1-2 business days.  For prescription refills, please ask your pharmacy to contact our office. Our fax number is [phone].  If you have an urgent issue when the clinic is closed that cannot wait until the next business day, you can page your doctor at the number below.    Please note that while we do our best to be available for urgent issues outside of office hours, we are not available 24/7.   If you have an urgent issue and are unable to reach us, you may choose to seek medical care at your doctor's  office, retail clinic, urgent care center, or emergency room.  If you have a medical emergency, please immediately call 911 or go to the emergency department.  Pager Numbers  - Dr. Kowalski: [phone]  - Dr. Moye: [phone]  - Dr. Stewart: [phone]  In the event of inclement weather, please call our main line at [phone] for an update on the status of any delays or closures.  Dermatology Medication Tips: Please keep the boxes that topical medications come in in order to help keep track of the instructions about where and how to use these. Pharmacies typically print the medication instructions only on the boxes and not directly on the medication tubes.   If your medication is too expensive, please contact our office at [phone] option 4 or send us a message through MyChart.   We are unable to tell what your co-pay for medications will be in advance as this is different depending on your insurance coverage. However, we may be able to find a substitute medication at lower cost or fill out paperwork to get insurance to cover a needed medication.   If a prior authorization is required to get your medication covered by your insurance company, please allow us 1-2 business days to complete this process.  Drug prices often vary depending on where the prescription is filled and some pharmacies may offer cheaper prices.  The website www.goodrx.com contains coupons for medications through different pharmacies. The prices here do not account for what the cost may be with help from insurance (it may be cheaper with your insurance), but the website can give you the price if you did not use any insurance.  - You can print the associated coupon and take it with your prescription to the pharmacy.  - You may also stop by our office during regular business hours and pick up a GoodRx coupon card.  - If you need your prescription sent electronically to a different pharmacy, notify our office  through [facility] MyChart or by phone at [phone] option 4.     Si Usted Necesita Algo Despus de Su Visita  Tambin puede enviarnos un mensaje a travs de MyChart. Por lo general respondemos a los mensajes de MyChart en el transcurso de 1 a 2 das hbiles.  Para renovar recetas, por favor pida a su farmacia que se ponga en contacto con nuestra oficina. Nuestro nmero de fax es el [phone].  Si tiene un asunto urgente cuando la clnica est cerrada y que no puede esperar hasta el siguiente da hbil, puede llamar/localizar a su doctor(a) al nmero que aparece a continuacin.   Por favor, tenga en cuenta que aunque hacemos todo lo posible para estar disponibles para asuntos urgentes fuera del horario de oficina, no estamos disponibles las 24 horas del da, los 7 das de la semana.   Si tiene un problema urgente y   no puede comunicarse con nosotros, puede optar por buscar atencin mdica  en el consultorio de su doctor(a), en una clnica privada, en un centro de atencin urgente o en una sala de emergencias.  Si tiene una emergencia mdica, por favor llame inmediatamente al 911 o vaya a la sala de emergencias.  Nmeros de bper  - Dr. Kowalski: [phone]  - Dra. Moye: [phone]  - Dra. Stewart: [phone]  En caso de inclemencias del tiempo, por favor llame a nuestra lnea principal al [phone] para una actualizacin sobre el estado de cualquier retraso o cierre.  Consejos para la medicacin en dermatologa: Por favor, guarde las cajas en las que vienen los medicamentos de uso tpico para ayudarle a seguir las instrucciones sobre dnde y cmo usarlos. Las farmacias generalmente imprimen las instrucciones del medicamento slo en las cajas y no directamente en los tubos del medicamento.   Si su medicamento es muy caro, por favor, pngase en contacto con nuestra oficina llamando al [phone] y presione la opcin 4 o envenos un mensaje a travs de MyChart.   No  podemos decirle cul ser su copago por los medicamentos por adelantado ya que esto es diferente dependiendo de la cobertura de su seguro. Sin embargo, es posible que podamos encontrar un medicamento sustituto a menor costo o llenar un formulario para que el seguro cubra el medicamento que se considera necesario.   Si se requiere una autorizacin previa para que su compaa de seguros cubra su medicamento, por favor permtanos de 1 a 2 das hbiles para completar este proceso.  Los precios de los medicamentos varan con frecuencia dependiendo del lugar de dnde se surte la receta y alguna farmacias pueden ofrecer precios ms baratos.  El sitio web www.goodrx.com tiene cupones para medicamentos de diferentes farmacias. Los precios aqu no tienen en cuenta lo que podra costar con la ayuda del seguro (puede ser ms barato con su seguro), pero el sitio web puede darle el precio si no utiliz ningn seguro.  - Puede imprimir el cupn correspondiente y llevarlo con su receta a la farmacia.  - Tambin puede pasar por nuestra oficina durante el horario de atencin regular y recoger una tarjeta de cupones de GoodRx.  - Si necesita que su receta se enve electrnicamente a una farmacia diferente, informe a nuestra oficina a travs de MyChart de [facility] o por telfono llamando al [phone] y presione la opcin 4.  

## 2022-05-31 NOTE — Progress Notes (Signed)
Follow-Up Visit   Subjective  Laurie Shields is a 88 y.o. female who presents for the following: Annual Exam (HxMM, HxMIS, HxSCC).  The patient presents for Total-Body Skin Exam (TBSE) for skin cancer screening and mole check.  The patient has spots, moles and lesions to be evaluated, some may be new or changing and the patient has concerns that these could be cancer. Several spots on back, arms, and scalp are irritated.   The following portions of the chart were reviewed this encounter and updated as appropriate:      Review of Systems: No other skin or systemic complaints except as noted in HPI or Assessment and Plan.   Objective  Well appearing patient in no apparent distress; mood and affect are within normal limits.  A full examination was performed including scalp, head, eyes, ears, nose, lips, neck, chest, axillae, abdomen, back, buttocks, bilateral upper extremities, bilateral lower extremities, hands, feet, fingers, toes, fingernails, and toenails. All findings within normal limits unless otherwise noted below.  Left Upper Arm x1, right forearm x1, mid back x3, left flank x5, frontal crown x1 (11) Erythematous keratotic or waxy stuck-on papule or plaque.  left clavicle 1.4 x 1.2cm tan patch     right lateral upper ankle 1.0cm two tone waxy tan macule       left abdomen 83m tan macule with darker edge      Assessment & Plan   History of Melanoma. Right ankle.  - No evidence of recurrence today - Recommend regular full body skin exams - Recommend daily broad spectrum sunscreen SPF 30+ to sun-exposed areas, reapply every 2 hours as needed.  - Call if any new or changing lesions are noted between office visits   History of Melanoma in Situ. Right upper forearm. Excised 09/08/2020. - No evidence of recurrence today - Recommend regular full body skin exams - Recommend daily broad spectrum sunscreen SPF 30+ to sun-exposed areas, reapply every 2 hours as needed.   - Call if any new or changing lesions are noted between office visits   History of Squamous Cell Carcinoma of the Skin - No evidence of recurrence today - Recommend regular full body skin exams - Recommend daily broad spectrum sunscreen SPF 30+ to sun-exposed areas, reapply every 2 hours as needed.  - Call if any new or changing lesions are noted between office visits  Lentigines - Scattered tan macules - Due to sun exposure - Benign-appearing, observe - Recommend daily broad spectrum sunscreen SPF 30+ to sun-exposed areas, reapply every 2 hours as needed. - Call for any changes  Seborrheic Keratoses - Stuck-on, waxy, tan-brown papules and/or plaques  - Benign-appearing - Discussed benign etiology and prognosis. - Observe - Call for any changes  Melanocytic Nevi - Tan-brown and/or pink-flesh-colored symmetric macules and papules - Benign appearing on exam today - Observation - Call clinic for new or changing moles - Recommend daily use of broad spectrum spf 30+ sunscreen to sun-exposed areas.   Hemangiomas - Red papules - Discussed benign nature - Observe - Call for any changes  Actinic Damage - Chronic condition, secondary to cumulative UV/sun exposure - diffuse scaly erythematous macules with underlying dyspigmentation - Recommend daily broad spectrum sunscreen SPF 30+ to sun-exposed areas, reapply every 2 hours as needed.  - Staying in the shade or wearing long sleeves, sun glasses (UVA+UVB protection) and wide brim hats (4-inch brim around the entire circumference of the hat) are also recommended for sun protection.  - Call for new  or changing lesions.  Skin cancer screening performed today.  Purpura - Chronic; persistent and recurrent.  Treatable, but not curable. Arms, right pretibia. Including left posterior ankle, photo taken today- in media.  - Violaceous macules and patches - Benign - Related to trauma, age, sun damage and/or use of blood thinners, chronic  use of topical and/or oral steroids - Observe - Can use OTC arnica containing moisturizer such as Dermend Bruise Formula if desired - Call for worsening or other concerns, recheck L posterior ankle at f/up visit   Inflamed seborrheic keratosis (11) Left Upper Arm x1, right forearm x1, mid back x3, left flank x5, frontal crown x1  Symptomatic, irritating, patient would like treated.  Destruction of lesion - Left Upper Arm x1, right forearm x1, mid back x3, left flank x5, frontal crown x1  Destruction method: cryotherapy   Informed consent: discussed and consent obtained   Lesion destroyed using liquid nitrogen: Yes   Region frozen until ice ball extended beyond lesion: Yes   Outcome: patient tolerated procedure well with no complications   Post-procedure details: wound care instructions given   Additional details:  Prior to procedure, discussed risks of blister formation, small wound, skin dyspigmentation, or rare scar following cryotherapy. Recommend Vaseline ointment to treated areas while healing.   Lentigo left clavicle  Lentigo vs Flat SK  Benign-appearing. Stable compared to previous visit. Observation.  Call clinic for new or changing moles.  Recommend daily use of broad spectrum spf 30+ sunscreen to sun-exposed areas.    Seborrheic keratosis right lateral upper ankle  Benign-appearing. Stable compared to previous visit. Observation.  Call clinic for new or changing moles.  Recommend daily use of broad spectrum spf 30+ sunscreen to sun-exposed areas.    Nevus left abdomen  Benign-appearing. Stable compared to previous visit. Observation.  Call clinic for new or changing moles.  Recommend daily use of broad spectrum spf 30+ sunscreen to sun-exposed areas.     Return in about 6 months (around 11/29/2022) for TBSE, HxMM, HxMIS, HxSCC.  I, Emelia Salisbury, CMA, am acting as scribe for Brendolyn Patty, MD.  Documentation: I have reviewed the above documentation for accuracy  and completeness, and I agree with the above.  Brendolyn Patty MD

## 2022-06-06 ENCOUNTER — Ambulatory Visit: Payer: Medicare Other | Admitting: Dermatology

## 2022-07-03 ENCOUNTER — Encounter (INDEPENDENT_AMBULATORY_CARE_PROVIDER_SITE_OTHER): Payer: Self-pay

## 2022-08-31 ENCOUNTER — Other Ambulatory Visit: Payer: Self-pay | Admitting: Internal Medicine

## 2022-08-31 DIAGNOSIS — Z1231 Encounter for screening mammogram for malignant neoplasm of breast: Secondary | ICD-10-CM

## 2022-09-14 ENCOUNTER — Encounter: Payer: Self-pay | Admitting: Urology

## 2022-09-14 ENCOUNTER — Ambulatory Visit: Payer: Medicare Other | Admitting: Urology

## 2022-09-14 VITALS — BP 110/71 | HR 80 | Ht 64.0 in | Wt 122.0 lb

## 2022-09-14 DIAGNOSIS — N2889 Other specified disorders of kidney and ureter: Secondary | ICD-10-CM

## 2022-09-14 NOTE — Progress Notes (Signed)
   09/14/22 11:11 AM   Laurie Shields [birth year] [id number]  CC: Left renal mass  HPI: 89 year old female with history of melanoma and mesenteric ischemia who was recently found to have a 7 mm left exophytic indeterminate renal lesion on CT from September 2023.  This was new from prior CT stone protocol in 2020.  She denies any gross hematuria or flank pain.  Creatinine 1.2, EGFR 43  PMH: Past Medical History:  Diagnosis Date   Atrial fibrillation ([place])    Cancer ([place])    melanoma   Melanoma ([place])    right ankle   Melanoma in situ ([place]) bx 08/03/20   right upper forearm, EXC 09/08/2020, margins free   SCC (squamous cell carcinoma) 08/11/2021   R pretibia, MOHs completed 09/22/2021   SCC (squamous cell carcinoma) 08/11/2021   R upper back/base of neck, EDC 09/07/2021   Squamous cell carcinoma of skin 03/11/2019   right mid pretibia ([facility] with [facility] 7/20)    Surgical History: Past Surgical History:  Procedure Laterality Date   CHOLECYSTECTOMY     MELANOMA EXCISION     VULVECTOMY       Family History: Family History  Problem Relation Age of Onset   Cancer Mother    Heart attack Mother    Alzheimer's disease Father    Breast cancer Neg Hx     Social History:  reports that she has never smoked. She has never been exposed to tobacco smoke. She has never used smokeless tobacco. She reports that she does not currently use alcohol. She reports that she does not currently use drugs.  Physical Exam: BP 110/71   Pulse 80   Ht '5\' 4"'$  (1.626 m)   Wt 122 lb (55.3 kg)   BMI 20.94 kg/m    Constitutional:  Alert and oriented, No acute distress. Cardiovascular: No clubbing, cyanosis, or edema. Respiratory: Normal respiratory effort, no increased work of breathing. GI: Abdomen is soft, nontender, nondistended, no abdominal masses   Pertinent Imaging: I have personally viewed and interpreted the CT abdomen and pelvis from September 2023 showing a small indeterminate 7 mm left  exophytic renal lesion.  Assessment & Plan:   89 year old female with history of melanoma and mesenteric ischemia with incidental finding of 7 mm exophytic left renal lesion, new from CT stone protocol in 2020.  We reviewed possible etiologies including cyst or tumor, and with her age and comorbidities surveillances very reasonable.  I recommended a repeat CT scan in 6 months.   RTC 6 months with CT abdomen and pelvis prior  Nickolas Madrid, MD 09/14/2022  [facility] [address], [place], [place] [address] [phone]

## 2022-09-14 NOTE — Patient Instructions (Signed)
You have a very small spot on your left kidney seen on the CT from September 2023.  This is only 0.7 cm and is too small to be able to ascertain if this is a tumor versus a benign cyst.  Even in the worst case if it is a tumor, these tend to grow very slowly and would be very unlikely to cause any symptoms or grow and spread outside the kidney.  We recommend monitoring this with a repeat imaging test in about 6 months.  Renal Mass  A renal mass is an abnormal growth in the kidney. It may be found while performing an MRI, CT scan, or ultrasound to evaluate other problems of the abdomen. A renal mass that is cancerous (malignant) may grow or spread quickly. Others are not cancerous (benign). Renal masses include: Tumors. These may be malignant or benign. The most common type of kidney cancer in adults is renal cell carcinoma. In children, the most common type of kidney cancer is Wilms tumor. The most common benign tumors of the kidney include renal adenomas, oncocytomas, and angiomyolipoma (AML). Cysts. These are fluid-filled sacs that form on or in the kidney. What are the causes? Certain types of cancers, infections, or injuries can cause a renal mass. It is not always known what causes a cyst to develop in or on the kidney. What are the signs or symptoms? Often, a renal mass does not cause any signs or symptoms; most kidney cysts do not cause symptoms. How is this diagnosed? Your health care provider may recommend tests to diagnose the cause of your renal mass. These tests may be done if a renal mass is found: Physical exam. Blood tests. Urine tests. Imaging tests, such as ultrasound, CT scan, or MRI. Biopsy. This is a small sample that is removed from the renal mass and tested in a lab. The exact tests and how often they are done will depend on: The size and appearance of the renal mass. Risk factors or medical conditions that increase your risk for problems. Any symptoms associated with  the renal mass, or concerns that you have about it. Tests and physical exams may be done once, or they may be done regularly for a period of time. Tests and exams that are done regularly will help monitor whether the mass is growing and beginning to cause problems. How is this treated? Treatment is not always needed for this condition. Your health care provider may recommend careful monitoring and regular tests and exams. Treatment will depend on the cause of the mass. Treatment for a cancerous renal mass may include surgical removal, chemotherapy, radiation, or immunotherapy. Most kidney cysts do not need to be treated. Follow these instructions at home: What you need to do at home will depend on the cause of the mass. Follow the instructions that your health care provider gives to you. In general: Take over-the-counter and prescription medicines only as told by your health care provider. If you were prescribed an antibiotic medicine, take it as told by your health care provider. Do not stop taking the antibiotic even if you start to feel better. Follow any restrictions that are given to you by your health care provider. Keep all follow-up visits. This is important. You may need to see your health care provider once or twice a year to have CT scans and ultrasounds. These tests will show if your renal mass has changed or grown. Contact a health care provider if you: Have pain in your side  or back (flank pain). Have a fever. Feel full soon after eating. Have pain or swelling in the abdomen. Lose weight. Get help right away if: Your pain gets worse. There is blood in your urine. You cannot urinate. You have chest pain. You have trouble breathing. These symptoms may represent a serious problem that is an emergency. Do not wait to see if the symptoms will go away. Get medical help right away. Call your local emergency services (911 in the [place] Summary A renal mass is an abnormal growth in  the kidney. It may be cancerous (malignant) and grow or spread quickly, or it may not be cancerous (benign). Renal masses often do not have any signs or symptoms. Renal masses may be found while performing an MRI, CT scan, or ultrasound for other problems of the abdomen. Your health care provider may recommend that you have tests to diagnose the cause of your renal mass. These may include a physical exam, blood tests, urine tests, imaging, or a biopsy. Treatment is not always needed for this condition. Careful monitoring may be recommended. This information is not intended to replace advice given to you by your health care provider. Make sure you discuss any questions you have with your health care provider. Document Revised: 02/16/2020 Document Reviewed: 02/16/2020 Elsevier Patient Education  [place].

## 2022-10-02 ENCOUNTER — Ambulatory Visit
Admission: RE | Admit: 2022-10-02 | Discharge: 2022-10-02 | Disposition: A | Discharge status: Home / Self Care | Payer: Medicare Other | Source: Ambulatory Visit | Attending: Urology | Admitting: Urology

## 2022-10-02 ENCOUNTER — Ambulatory Visit
Admission: RE | Admit: 2022-10-02 | Discharge: 2022-10-02 | Disposition: A | Discharge status: Home / Self Care | Payer: Medicare Other | Source: Ambulatory Visit | Attending: Internal Medicine | Admitting: Internal Medicine

## 2022-10-02 DIAGNOSIS — Z1231 Encounter for screening mammogram for malignant neoplasm of breast: Secondary | ICD-10-CM | POA: Insufficient documentation

## 2022-10-02 DIAGNOSIS — N2889 Other specified disorders of kidney and ureter: Secondary | ICD-10-CM | POA: Diagnosis present

## 2022-10-02 LAB — POCT I-STAT CREATININE: Creatinine, Ser: 1.1 mg/dL — ABNORMAL HIGH (ref 0.44–1.00)

## 2022-10-02 MED ORDER — IOHEXOL 350 MG/ML SOLN
100.0000 mL | Freq: Once | INTRAVENOUS | Status: AC | PRN
Start: 2022-10-02 — End: 2022-10-02
  Administered 2022-10-02: 100 mL via INTRAVENOUS

## 2022-10-11 ENCOUNTER — Other Ambulatory Visit: Payer: Self-pay

## 2022-10-11 DIAGNOSIS — N2889 Other specified disorders of kidney and ureter: Secondary | ICD-10-CM

## 2022-11-21 ENCOUNTER — Ambulatory Visit (INDEPENDENT_AMBULATORY_CARE_PROVIDER_SITE_OTHER): Payer: Medicare Other | Admitting: Vascular Surgery

## 2022-11-21 ENCOUNTER — Encounter (INDEPENDENT_AMBULATORY_CARE_PROVIDER_SITE_OTHER): Payer: Self-pay | Admitting: Vascular Surgery

## 2022-11-21 ENCOUNTER — Ambulatory Visit (INDEPENDENT_AMBULATORY_CARE_PROVIDER_SITE_OTHER): Payer: Medicare Other

## 2022-11-21 VITALS — BP 149/82 | HR 61 | Resp 16 | Wt 124.8 lb

## 2022-11-21 DIAGNOSIS — I701 Atherosclerosis of renal artery: Secondary | ICD-10-CM

## 2022-11-21 DIAGNOSIS — I4891 Unspecified atrial fibrillation: Secondary | ICD-10-CM

## 2022-11-21 DIAGNOSIS — I7410 Embolism and thrombosis of unspecified parts of aorta: Secondary | ICD-10-CM

## 2022-11-21 DIAGNOSIS — I771 Stricture of artery: Secondary | ICD-10-CM

## 2022-11-21 NOTE — Assessment & Plan Note (Signed)
She had a previous CT angiogram demonstrating some degree of renal artery stenosis so a duplex was done today.  This demonstrated velocities consistent with 1 to 59% renal artery stenosis bilaterally without any obvious greater than 60% stenosis.  Her kidneys were relatively small but symmetric bilaterally.  No role for intervention.  I think an annual follow-up with duplex would be adequate at this point.  No change in medications.

## 2022-11-21 NOTE — Progress Notes (Signed)
MRN : [id number]  Laurie Shields is a 89 y.o. (10-05-1932) female who presents with chief complaint of  Chief Complaint  Patient presents with   Follow-up    Ultrasound follow up  .  History of Present Illness: Patient returns today in follow up.  She has been doing well with only insomnia as a major complaint at this time.  Her blood pressure has remained relatively stable and generally normal to mildly elevated.  No decrease in renal function to her knowledge.  She had a previous CT angiogram demonstrating some degree of renal artery stenosis so a duplex was done today.  This demonstrated velocities consistent with 1 to 59% renal artery stenosis bilaterally without any obvious greater than 60% stenosis.  Her kidneys were relatively small but symmetric bilaterally.  Current Outpatient Medications  Medication Sig Dispense Refill   Cholecalciferol (VITAMIN D3) 50 MCG (2000 UT) capsule Take 2,000 Units by mouth daily.     citalopram (CELEXA) 10 MG tablet Take 10 mg by mouth daily.     co-enzyme Q-10 30 MG capsule Take 30 mg by mouth daily.      dicyclomine (BENTYL) 10 MG capsule Take 10 mg by mouth 4 (four) times daily -  before meals and at bedtime.     diltiazem (DILACOR XR) 240 MG 24 hr capsule Take 240 mg by mouth daily.      ELIQUIS 2.5 MG TABS tablet Take 2.5 mg by mouth daily.     metoprolol tartrate (LOPRESSOR) 25 MG tablet Take 25 mg by mouth daily.     Multiple Vitamin (MULTIVITAMIN) tablet Take 1 tablet by mouth daily.     No current facility-administered medications for this visit.    Past Medical History:  Diagnosis Date   Atrial fibrillation ([place])    Cancer ([place])    melanoma   Melanoma ([place])    right ankle   Melanoma in situ ([place]) bx 08/03/20   right upper forearm, EXC 09/08/2020, margins free   SCC (squamous cell carcinoma) 08/11/2021   R pretibia, MOHs completed 09/22/2021   SCC (squamous cell carcinoma) 08/11/2021   R upper back/base of neck, EDC 09/07/2021    Squamous cell carcinoma of skin 03/11/2019   right mid pretibia (tx with [facility] 7/20)    Past Surgical History:  Procedure Laterality Date   CHOLECYSTECTOMY     MELANOMA EXCISION     VULVECTOMY       Social History   Tobacco Use   Smoking status: Never    Passive exposure: Never   Smokeless tobacco: Never  Substance Use Topics   Alcohol use: Not Currently   Drug use: Not Currently       Family History  Problem Relation Age of Onset   Cancer Mother    Heart attack Mother    Alzheimer's disease Father    Breast cancer Neg Hx      Allergies  Allergen Reactions   Trazodone Other (See Comments) and Nausea Only    Blurred vision Other reaction(s): Other (See Comments) Blurred vision     REVIEW OF SYSTEMS (Negative unless checked)  Constitutional: [] Weight loss  [] Fever  [] Chills Cardiac: [] Chest pain   [] Chest pressure   [] Palpitations   [] Shortness of breath when laying flat   [] Shortness of breath at rest   [] Shortness of breath with exertion. Vascular:  [] Pain in legs with walking   [] Pain in legs at rest   [] Pain in legs when laying flat   [] Claudication   []   Pain in feet when walking  [] Pain in feet at rest  [] Pain in feet when laying flat   [] History of DVT   [] Phlebitis   [] Swelling in legs   [] Varicose veins   [] Non-healing ulcers Pulmonary:   [] Uses home oxygen   [] Productive cough   [] Hemoptysis   [] Wheeze  [] COPD   [] Asthma Neurologic:  [] Dizziness  [] Blackouts   [] Seizures   [] History of stroke   [] History of TIA  [] Aphasia   [] Temporary blindness   [] Dysphagia   [] Weakness or numbness in arms   [] Weakness or numbness in legs Musculoskeletal:  [x] Arthritis   [] Joint swelling   [] Joint pain   [] Low back pain Hematologic:  [] Easy bruising  [] Easy bleeding   [] Hypercoagulable state   [] Anemic   Gastrointestinal:  [] Blood in stool   [] Vomiting blood  [] Gastroesophageal reflux/heartburn   [x] Abdominal pain Genitourinary:  [] Chronic kidney disease   [] Difficult  urination  [] Frequent urination  [] Burning with urination   [] Hematuria Skin:  [] Rashes   [] Ulcers   [] Wounds Psychological:  [] History of anxiety   []  History of major depression.  Physical Examination  BP (!) 149/82 (BP Location: Right Arm)   Pulse 61   Resp 16   Wt 124 lb 12.8 oz (56.6 kg)   BMI 21.42 kg/m  Gen:  WD/WN, NAD. Appears younger than stated age. Head: [place]/AT, No temporalis wasting. Ear/Nose/Throat: Hearing grossly intact, nares w/o erythema or drainage Eyes: Conjunctiva clear. Sclera non-icteric Neck: Supple.  Trachea midline Pulmonary:  Good air movement, no use of accessory muscles.  Cardiac: RRR, no JVD Vascular:  Vessel Right Left  Radial Palpable Palpable                                   Gastrointestinal: soft, non-tender/non-distended. No guarding/reflex.  Musculoskeletal: M/S 5/5 throughout.  No deformity or atrophy. No edema. Neurologic: Sensation grossly intact in extremities.  Symmetrical.  Speech is fluent.  Psychiatric: Judgment intact, Mood & affect appropriate for pt's clinical situation. Dermatologic: No rashes or ulcers noted.  No cellulitis or open wounds.      Labs Recent Results (from the past 2160 hour(s))  I-STAT creatinine     Status: Abnormal   Collection Time: 10/02/22  2:38 PM  Result Value Ref Range   Creatinine, Ser 1.10 (H) 0.44 - 1.00 mg/dL    Radiology No results found.  Assessment/Plan Atrial fibrillation (HCC) On anticoagulation and rate controlled   Celiac artery stenosis ([place]) CTA reviewed.  There is some mild celiac artery stenosis but is not hemodynamically significant and her SMA and IMA are patent as well.  The duplex likely overestimated the degree of stenosis based on some tortuosity and possible extrinsic compression but this is not significant.  No further work-up planned.   Aortic mural thrombus (HCC) CT angiogram reviewed she does have some distal descending thoracic aortic mural thrombus which is  moderate in nature.  She is already on anticoagulation and is not really symptomatic from this.  No distal embolization appears present.  Would continue anticoagulation and no further work-up planned.  Renal artery stenosis (HCC) She had a previous CT angiogram demonstrating some degree of renal artery stenosis so a duplex was done today.  This demonstrated velocities consistent with 1 to 59% renal artery stenosis bilaterally without any obvious greater than 60% stenosis.  Her kidneys were relatively small but symmetric bilaterally.  No role for intervention.  I  think an annual follow-up with duplex would be adequate at this point.  No change in medications.    Leotis Pain, MD  11/21/2022 10:34 AM    This note was created with Dragon medical transcription system.  Any errors from dictation are purely unintentional

## 2022-12-17 ENCOUNTER — Emergency Department: Payer: Medicare Other

## 2022-12-17 ENCOUNTER — Encounter: Payer: Self-pay | Admitting: Emergency Medicine

## 2022-12-17 ENCOUNTER — Observation Stay
Admission: EM | Admit: 2022-12-17 | Discharge: 2022-12-22 | Disposition: A | Discharge status: Skilled Nursing Facility | Payer: Medicare Other | Attending: Internal Medicine | Admitting: Internal Medicine

## 2022-12-17 ENCOUNTER — Other Ambulatory Visit: Payer: Self-pay

## 2022-12-17 DIAGNOSIS — Z789 Other specified health status: Secondary | ICD-10-CM

## 2022-12-17 DIAGNOSIS — S7011XA Contusion of right thigh, initial encounter: Principal | ICD-10-CM

## 2022-12-17 DIAGNOSIS — I480 Paroxysmal atrial fibrillation: Secondary | ICD-10-CM | POA: Diagnosis not present

## 2022-12-17 DIAGNOSIS — S7001XA Contusion of right hip, initial encounter: Secondary | ICD-10-CM

## 2022-12-17 DIAGNOSIS — S0990XA Unspecified injury of head, initial encounter: Secondary | ICD-10-CM | POA: Insufficient documentation

## 2022-12-17 DIAGNOSIS — D62 Acute posthemorrhagic anemia: Secondary | ICD-10-CM

## 2022-12-17 DIAGNOSIS — Z7901 Long term (current) use of anticoagulants: Secondary | ICD-10-CM | POA: Diagnosis not present

## 2022-12-17 DIAGNOSIS — R112 Nausea with vomiting, unspecified: Secondary | ICD-10-CM | POA: Diagnosis not present

## 2022-12-17 DIAGNOSIS — R52 Pain, unspecified: Secondary | ICD-10-CM

## 2022-12-17 DIAGNOSIS — G4733 Obstructive sleep apnea (adult) (pediatric): Secondary | ICD-10-CM

## 2022-12-17 DIAGNOSIS — W01198A Fall on same level from slipping, tripping and stumbling with subsequent striking against other object, initial encounter: Secondary | ICD-10-CM | POA: Insufficient documentation

## 2022-12-17 DIAGNOSIS — E119 Type 2 diabetes mellitus without complications: Secondary | ICD-10-CM | POA: Diagnosis not present

## 2022-12-17 DIAGNOSIS — Z79899 Other long term (current) drug therapy: Secondary | ICD-10-CM | POA: Diagnosis not present

## 2022-12-17 DIAGNOSIS — S7001XS Contusion of right hip, sequela: Secondary | ICD-10-CM

## 2022-12-17 DIAGNOSIS — M25551 Pain in right hip: Secondary | ICD-10-CM | POA: Diagnosis present

## 2022-12-17 DIAGNOSIS — I4891 Unspecified atrial fibrillation: Secondary | ICD-10-CM | POA: Diagnosis present

## 2022-12-17 DIAGNOSIS — Z85828 Personal history of other malignant neoplasm of skin: Secondary | ICD-10-CM | POA: Diagnosis not present

## 2022-12-17 DIAGNOSIS — W19XXXA Unspecified fall, initial encounter: Secondary | ICD-10-CM

## 2022-12-17 LAB — CBC WITH DIFFERENTIAL/PLATELET
Abs Immature Granulocytes: 0.1 10*3/uL — ABNORMAL HIGH (ref 0.00–0.07)
Basophils Absolute: 0.1 10*3/uL (ref 0.0–0.1)
Basophils Relative: 1 %
Eosinophils Absolute: 0.2 10*3/uL (ref 0.0–0.5)
Eosinophils Relative: 2 %
HCT: 36.9 % (ref 36.0–46.0)
Hemoglobin: 11.9 g/dL — ABNORMAL LOW (ref 12.0–15.0)
Immature Granulocytes: 1 %
Lymphocytes Relative: 18 %
Lymphs Abs: 1.7 10*3/uL (ref 0.7–4.0)
MCH: 30.2 pg (ref 26.0–34.0)
MCHC: 32.2 g/dL (ref 30.0–36.0)
MCV: 93.7 fL (ref 80.0–100.0)
Monocytes Absolute: 0.7 10*3/uL (ref 0.1–1.0)
Monocytes Relative: 8 %
Neutro Abs: 6.7 10*3/uL (ref 1.7–7.7)
Neutrophils Relative %: 70 %
Platelets: 223 10*3/uL (ref 150–400)
RBC: 3.94 MIL/uL (ref 3.87–5.11)
RDW: 13.5 % (ref 11.5–15.5)
WBC: 9.4 10*3/uL (ref 4.0–10.5)
nRBC: 0 % (ref 0.0–0.2)

## 2022-12-17 LAB — COMPREHENSIVE METABOLIC PANEL
ALT: 18 U/L (ref 0–44)
AST: 25 U/L (ref 15–41)
Albumin: 4.1 g/dL (ref 3.5–5.0)
Alkaline Phosphatase: 84 U/L (ref 38–126)
Anion gap: 5 (ref 5–15)
BUN: 26 mg/dL — ABNORMAL HIGH (ref 8–23)
CO2: 28 mmol/L (ref 22–32)
Calcium: 9.4 mg/dL (ref 8.9–10.3)
Chloride: 102 mmol/L (ref 98–111)
Creatinine, Ser: 0.98 mg/dL (ref 0.44–1.00)
GFR, Estimated: 56 mL/min — ABNORMAL LOW (ref >60)
Glucose, Bld: 96 mg/dL (ref 70–99)
Potassium: 4.2 mmol/L (ref 3.5–5.1)
Sodium: 135 mmol/L (ref 135–145)
Total Bilirubin: 0.8 mg/dL (ref 0.3–1.2)
Total Protein: 6.6 g/dL (ref 6.5–8.1)

## 2022-12-17 LAB — PROTIME-INR
INR: 1.1 (ref 0.8–1.2)
Prothrombin Time: 14.3 seconds (ref 11.4–15.2)

## 2022-12-17 LAB — APTT: aPTT: 32 seconds (ref 24–36)

## 2022-12-17 MED ORDER — FENTANYL CITRATE PF 50 MCG/ML IJ SOSY
50.0000 ug | PREFILLED_SYRINGE | Freq: Once | INTRAMUSCULAR | Status: AC
  Administered 2022-12-17: 50 ug via INTRAVENOUS
  Filled 2022-12-17: qty 1

## 2022-12-17 NOTE — ED Provider Notes (Signed)
[facility] Provider Note    Event Date/Time   First MD Initiated Contact with Patient 12/17/22 2214     (approximate)  History   Chief Complaint: Fall (On [place])  HPI  Beyza Bellino is a 89 y.o. female with a past medical history of atrial fibrillation on Eliquis, presents to the emergency department after a fall with significant right hip pain.  According to the patient she was leaning over to smell a flower when she lost her balance falling backwards landing on her pelvis and then hitting the back of her head.  Patient denies LOC but she is on Eliquis.  Denies any neck chest or abdominal pain.  Patient only complaint is a skin tear to the right elbow and significant right hip pain with any attempted range of motion or palpation.  Physical Exam   Triage Vital Signs: ED Triage Vitals  Enc Vitals Group     BP 12/17/22 2145 (!) 158/98     Pulse Rate 12/17/22 2145 70     Resp 12/17/22 2145 18     Temp 12/17/22 2145 (!) 97.5 F (36.4 C)     Temp Source 12/17/22 2145 Oral     SpO2 12/17/22 2145 96 %     Weight 12/17/22 2148 124 lb (56.2 kg)     Height 12/17/22 2148  (1.626 m)     Head Circumference --      Peak Flow --      Pain Score 12/17/22 2148 10     Pain Loc --      Pain Edu? --      Excl. in GC? --     Most recent vital signs: Vitals:   12/17/22 2145  BP: (!) 158/98  Pulse: 70  Resp: 18  Temp: (!) 97.5 F (36.4 C)  SpO2: 96%    General: Awake, no distress.  CV:  Good peripheral perfusion.  Regular rate and rhythm  Resp:  Normal effort.  Equal breath sounds bilaterally.  Abd:  No distention.  Soft, nontender.  No rebound or guarding. Other:  Patient has significant right hip tenderness to palpation as well as pain with attempted range of motion of the right lower extremity.  Neuro vastly intact distally.  Patient has a small hematoma to the right occipital scalp no other significant findings besides a small skin tear to the right  elbow.  Great range of motion in upper extremities.  No C-spine tenderness.  No back pain.   ED Results / Procedures / Treatments   EKG  EKG viewed and interpreted by myself shows a normal sinus rhythm at 62 bpm with a narrow QRS, normal axis, normal intervals, no concerning ST changes.  RADIOLOGY  I have reviewed and interpreted the hip x-ray images.  I do not see any obvious fracture of the hip itself possible small fracture of the greater trochanter.   MEDICATIONS ORDERED IN ED: Medications  fentaNYL (SUBLIMAZE) injection 50 mcg (has no administration in time range)     IMPRESSION / MDM / ASSESSMENT AND PLAN / ED COURSE  I reviewed the triage vital signs and the nursing notes.  Patient's presentation is most consistent with acute presentation with potential threat to life or bodily function.  Patient presents to the emergency department after a fall.  Patient is on Eliquis did hit her head.  Will obtain CT images of the head and C-spine as a precaution.  Patient's main complaint significant right hip pain.  No shortening or external rotation.  Neurovascular intact distally.  Patient does have significant tenderness to palpation of the right hip as well as pain with range of motion of the right hip.  Patient has a small skin tear to the right elbow.  We will cover with Xeroform and Kerlix.  We will obtain x-rays of the right hip we will check labs and continue to closely monitor.  Patient's CBC shows no concerning findings, chemistry reassuring.  INR normal.  Patient's hip x-ray read as negative by radiology.  Given the patient's significant hip pain and tenderness with range of motion or palpation we will obtain a CT scan of the right hip to further evaluate.  Patient care signed out to oncoming provider CT scans pending.  FINAL CLINICAL IMPRESSION(S) / ED DIAGNOSES   Fall Right hip pain    Note:  This document was prepared using Dragon voice recognition software and may  include unintentional dictation errors.   Minna Antis, MD 12/17/22 [phone]

## 2022-12-17 NOTE — ED Provider Notes (Signed)
-----------------------------------------   11:22 PM on 12/17/2022 -----------------------------------------  Assuming care from Dr. Lenard Lance.  In short, Laurie Shields is a 89 y.o. female with a chief complaint of right hip pain after fall, while on Eliquis.  Refer to the original H&P for additional details.  The current plan of care is to follow up CT of the right hip and reassess patient's pain.   Clinical Course as of 12/18/22 0147  Mon Dec 18, 2022  3086 I viewed and interpreted the patient's CT right hip.  I see no fracture or dislocation but she does have a hematoma.  Radiologist confirmed that she has a moderate-sized hematoma adjacent to her greater trochanter, which is likely the cause of her pain.  I reassessed her and she is in quite a bit of pain and discomfort when she tries to move around.  She is concerned about her ability to function or bear any weight.  Given that she is on Eliquis, I am concerned that she may have active extravasation into the hematoma which could drop her H&H quite substantially.  We discussed that and agreed to try oral pain medicine to see if that will adequately control her pain, and I ordered a CT femur right with contrast to better assess the possibility of active extravasation.  Will then determine appropriate disposition plans. [CF]  0135 CT FEMUR RIGHT W CONTRAST I viewed and interpreted the patient's CT scan, and she seems to have 2 areas of active extravasation into her right thigh.  This is consistent with her pain.  Given that she is on Eliquis, and given that she could have a substantial amount of blood loss into her thigh and could potentially lead to compartment syndrome (even though there is no sign of it now), she needs to be observed in the hospital.  I consulted by phone and secure chat text with Dr. Hyacinth Meeker with orthopedic surgery and agrees with the plan and will see the patient in the morning if consulted by the hospitalist.  I ordered an Ace  wrap and knee immobilizer and extremity elevation as per a discussion with Dr. Hyacinth Meeker, and I will consult the hospitalist for admission.  The patient will need serial H&H and pain management.  I also ordered 3 doses of as needed morphine 4 mg IV every 3 hours.  I updated the patient and her husband and they understand and agree with the plan. [CF]  0147 Consulted with Dr. Para March with the hospitalist service who will admit. [CF]    Clinical Course User Index [CF] [name], MD     Medications  bacitracin ointment (has no administration in time range)  morphine (PF) 4 MG/ML injection 4 mg (has no administration in time range)  fentaNYL (SUBLIMAZE) injection 50 mcg (50 mcg Intravenous Given 12/17/22 2238)  HYDROcodone-acetaminophen (NORCO/VICODIN) 5-325 MG per tablet 2 tablet (2 tablets Oral Given 12/18/22 0051)  iohexol (OMNIPAQUE) 300 MG/ML solution 100 mL (100 mLs Intravenous Contrast Given 12/18/22 0044)     ED Discharge Orders     None      Final diagnoses:  Fall, initial encounter  Contusion of right hip and thigh, initial encounter  Thigh hematoma, right, initial encounter  Active bleeding  Current use of long term anticoagulation  Intractable pain     [name], MD 12/18/22 0147

## 2022-12-17 NOTE — ED Triage Notes (Signed)
Patient c/o falling, hitting her head.  Patient is on eliquis, c/o right hip pain, head pain and shin pain.  Patient has hematoma to the back of head, and small bruises on legs.  Patient has bandaged skin tear to right elbow.

## 2022-12-18 ENCOUNTER — Encounter: Payer: Self-pay | Admitting: Internal Medicine

## 2022-12-18 ENCOUNTER — Emergency Department: Payer: Medicare Other

## 2022-12-18 DIAGNOSIS — Z7901 Long term (current) use of anticoagulants: Secondary | ICD-10-CM | POA: Diagnosis not present

## 2022-12-18 DIAGNOSIS — S7011XA Contusion of right thigh, initial encounter: Secondary | ICD-10-CM

## 2022-12-18 DIAGNOSIS — W19XXXS Unspecified fall, sequela: Secondary | ICD-10-CM

## 2022-12-18 DIAGNOSIS — D62 Acute posthemorrhagic anemia: Secondary | ICD-10-CM

## 2022-12-18 DIAGNOSIS — G4733 Obstructive sleep apnea (adult) (pediatric): Secondary | ICD-10-CM

## 2022-12-18 DIAGNOSIS — S7011XS Contusion of right thigh, sequela: Secondary | ICD-10-CM | POA: Diagnosis not present

## 2022-12-18 DIAGNOSIS — S7001XS Contusion of right hip, sequela: Secondary | ICD-10-CM

## 2022-12-18 DIAGNOSIS — S7001XA Contusion of right hip, initial encounter: Secondary | ICD-10-CM

## 2022-12-18 DIAGNOSIS — W19XXXA Unspecified fall, initial encounter: Secondary | ICD-10-CM

## 2022-12-18 DIAGNOSIS — I480 Paroxysmal atrial fibrillation: Secondary | ICD-10-CM

## 2022-12-18 LAB — BPAM RBC
Blood Product Expiration Date: 202405202359
Blood Product Expiration Date: 202405212359
Unit Type and Rh: 5100

## 2022-12-18 LAB — HEMOGLOBIN
Hemoglobin: 9.3 g/dL — ABNORMAL LOW (ref 12.0–15.0)
Hemoglobin: 8.6 g/dL — ABNORMAL LOW (ref 12.0–15.0)
Hemoglobin: 8.6 g/dL — ABNORMAL LOW (ref 12.0–15.0)

## 2022-12-18 LAB — ABO/RH: ABO/RH(D): O POS

## 2022-12-18 MED ORDER — ACETAMINOPHEN 325 MG PO TABS
650.0000 mg | ORAL_TABLET | Freq: Four times a day (QID) | ORAL | Status: DC | PRN
  Administered 2022-12-18 – 2022-12-21 (×8): 650 mg via ORAL
  Filled 2022-12-18 (×7): qty 2

## 2022-12-18 MED ORDER — HYDROCODONE-ACETAMINOPHEN 5-325 MG PO TABS
1.0000 | ORAL_TABLET | ORAL | Status: DC | PRN
  Administered 2022-12-18: 1 via ORAL
  Filled 2022-12-18: qty 1

## 2022-12-18 MED ORDER — SODIUM CHLORIDE 0.9 % IV BOLUS
1000.0000 mL | Freq: Once | INTRAVENOUS | Status: AC
  Administered 2022-12-18: 1000 mL via INTRAVENOUS

## 2022-12-18 MED ORDER — HYDROCODONE-ACETAMINOPHEN 5-325 MG PO TABS
2.0000 | ORAL_TABLET | Freq: Once | ORAL | Status: AC
  Administered 2022-12-18: 2 via ORAL
  Filled 2022-12-18: qty 2

## 2022-12-18 MED ORDER — ACETAMINOPHEN 650 MG RE SUPP: 650.0000 mg | Freq: Four times a day (QID) | RECTAL | Status: DC | PRN

## 2022-12-18 MED ORDER — ONDANSETRON HCL 4 MG/2ML IJ SOLN
4.0000 mg | Freq: Four times a day (QID) | INTRAMUSCULAR | Status: DC | PRN
  Administered 2022-12-18 – 2022-12-20 (×2): 4 mg via INTRAVENOUS
  Filled 2022-12-18 (×2): qty 2

## 2022-12-18 MED ORDER — MORPHINE SULFATE (PF) 2 MG/ML IV SOLN: 2.0000 mg | INTRAVENOUS | Status: DC | PRN

## 2022-12-18 MED ORDER — METOPROLOL SUCCINATE ER 25 MG PO TB24
25.0000 mg | ORAL_TABLET | Freq: Every day | ORAL | Status: DC
  Administered 2022-12-18 – 2022-12-22 (×5): 25 mg via ORAL
  Filled 2022-12-18 (×5): qty 1

## 2022-12-18 MED ORDER — SODIUM CHLORIDE 0.9% IV SOLUTION
Freq: Once | INTRAVENOUS | Status: DC
  Filled 2022-12-18: qty 250

## 2022-12-18 MED ORDER — IOHEXOL 300 MG/ML  SOLN
100.0000 mL | Freq: Once | INTRAMUSCULAR | Status: AC | PRN
  Administered 2022-12-18: 100 mL via INTRAVENOUS

## 2022-12-18 MED ORDER — ONDANSETRON HCL 4 MG PO TABS: 4.0000 mg | ORAL_TABLET | Freq: Four times a day (QID) | ORAL | Status: DC | PRN

## 2022-12-18 MED ORDER — MORPHINE SULFATE (PF) 4 MG/ML IV SOLN
4.0000 mg | INTRAVENOUS | Status: DC | PRN
  Filled 2022-12-18: qty 1

## 2022-12-18 MED ORDER — HYDROMORPHONE HCL 1 MG/ML IJ SOLN: 0.5000 mg | INTRAMUSCULAR | Status: DC | PRN

## 2022-12-18 MED ORDER — ADULT MULTIVITAMIN W/MINERALS CH
1.0000 | ORAL_TABLET | Freq: Every day | ORAL | Status: DC
  Administered 2022-12-18 – 2022-12-22 (×5): 1 via ORAL
  Filled 2022-12-18 (×5): qty 1

## 2022-12-18 MED ORDER — BACITRACIN ZINC 500 UNIT/GM EX OINT
TOPICAL_OINTMENT | Freq: Two times a day (BID) | CUTANEOUS | Status: DC
  Administered 2022-12-18 (×2): 1 via TOPICAL
  Filled 2022-12-18 (×9): qty 0.9

## 2022-12-18 MED ORDER — SODIUM CHLORIDE 0.9 % IV SOLN: INTRAVENOUS | Status: DC

## 2022-12-18 NOTE — ED Notes (Signed)
Assumed care of pt, found her in bed w/ spouse bedside.  She is alert and oriented and appears to be in good spirits.  States her breathing "feels much better."  States she is not in pain when still and states that the knee embolize helps with that.  Reports she did eat her dinner and has no other needs at this time.

## 2022-12-18 NOTE — Assessment & Plan Note (Deleted)
Acute blood loss anemia in the setting of chronic anticoagulation Accidental fall Hemoglobin 11.9, down from 12.9 a couple months prior  CT femur with contrast showing active extravasation Pain control Recommendations per Ortho consult from the ED, Dr. Hyacinth Meeker - Hold Eliquis - Serial H&H - Knee immobilizer, Ace wrap and keep leg elevated Will additionally hold antihypertensives Will give a fluid bolus  Addendum Hb 11.9->9.3.Laurie KitchenMarland Shields Patient otherwise hemodynamically stable, awake alert and conversant on reassessment Continue to monitor closely Discussed transfusion  with patient and husband at bedside and they are agreeable in the event transfusion is needed    12/18/2022    3:30 AM 12/18/2022    2:30 AM 12/18/2022    2:01 AM  Vitals with BMI  Systolic 152 110 85  Diastolic 65 71 50  Pulse 69 67 67

## 2022-12-18 NOTE — ED Notes (Signed)
Called lab to request the hemoglobin collect be done by them.

## 2022-12-18 NOTE — Progress Notes (Signed)
Progress Note   Patient: Laurie Shields [id number] DOB: [birth year] DOA: 12/17/2022     0 DOS: the patient was seen and examined on 12/18/2022   Brief hospital course:  89 y.o. female with medical history significant for Paroxysmal A-fib on low-dose Eliquis, OSA intolerant of CPAP, chronic exertional dyspnea, diabetes, who presents to the ED with right hip pain following an accidental fall.  Patient was leaning over to smell left lower and lost her footing falling backwards landing on her right hip and hitting the back of the head.  She had no loss of consciousness.  Apart from severe right hip pain she also sustained a skin tear on her right elbow. ED course and data review: Vitals within normal limits except For slightly elevated blood pressure.  Labs unremarkable with hemoglobin 11.9, down from 12.9 on 10/12/2022 EKG showing sinus at 62. Trauma imaging with CT head and C-spine unremarkable CT femur with contrast shows:Two foci of active extravasation along the right hip/lateral thigh musculature, as described above  Assessment and Plan: * Traumatic hematoma of right thigh Traumatic hematoma right hip.  Serial hemoglobins.  Transfuse as needed.  Hold Eliquis.  Pain control.  Orthopedic evaluation.  ABLA (acute blood loss anemia) Benefits and risk of blood transfusion explained.  Hold off on IV fluid at this point.  Chronic anticoagulation Hold Eliquis  Accidental fall PT and OT evaluations.  OSA on CPAP Patient intolerant of CPAP  Atrial fibrillation Paroxysmal in nature.  Holding Eliquis due to active bleeding.  Restart low-dose Toprol-XL.  Hold Cardizem.        Subjective: Patient seen this morning.  She had a fall and found to have right hip pain.  Found to have bleeding into the musculature on the right hip and thigh.  Physical Exam: Vitals:   12/18/22 1144 12/18/22 1200 12/18/22 1230 12/18/22 1300  BP:  137/74 117/64 (!) 101/52  Pulse:  86 80 80  Resp:  (!) 23 18 17    Temp: 97.9 F (36.6 C)     TempSrc: Oral     SpO2:  100% 99% 97%  Weight:      Height:       Physical Exam HENT:     Head: Normocephalic.     Mouth/Throat:     Pharynx: No oropharyngeal exudate.  Eyes:     General: Lids are normal.     Conjunctiva/sclera: Conjunctivae normal.  Cardiovascular:     Rate and Rhythm: Normal rate and regular rhythm.     Heart sounds: Normal heart sounds, S1 normal and S2 normal.  Pulmonary:     Breath sounds: No decreased breath sounds, wheezing, rhonchi or rales.  Abdominal:     Palpations: Abdomen is soft.     Tenderness: There is no abdominal tenderness.  Musculoskeletal:     Right lower leg: No swelling.     Left lower leg: No swelling.     Comments: Right leg wrapped with Ace bandage and a brace.  Skin:    General: Skin is warm.     Findings: No rash.  Neurological:     Mental Status: She is alert and oriented to person, place, and time.     Comments: Able to wiggle toes on right side and flex and extend at the ankle.     Data Reviewed: Hemoglobin 11.9 on admit and down to 8.6.  Creatinine 0.98 CT cervical spine and CT scan of the head showed extracranial hematoma over the right parietal bone. CT right  hip shows moderate subcutaneous hematoma along the lateral right thigh overlying the greater trochanter CT of right thigh shows 2 foci of active extravasation along the right hip lateral thigh musculature unchanged from the previous CT scan of the right hip.  Family Communication: Tried to reach husband at [phone]  Disposition: Status is: Observation With serial hemoglobins.  Awaiting orthopedic evaluation.  Planned Discharge Destination: To be determined    Time spent: 30 minutes  Author: Alford Highland, MD 12/18/2022 2:48 PM  For on call review [email or web address].

## 2022-12-18 NOTE — Assessment & Plan Note (Signed)
Sliding scale insulin coverage 

## 2022-12-18 NOTE — IPAL (Signed)
  Interdisciplinary Goals of Care Family Meeting   Date carried out: 12/18/2022  Location of the meeting: Bedside  Member's involved: Physician and Family Member or next of kin  Durable Power of Attorney or Environmental health practitioner: patient and husband    Discussion: We discussed goals of care for [employer] .   I have reviewed medical records including EPIC notes, labs and imaging, assessed the patient and then met with patient and husband to discuss major active diagnoses, plan of care, natural trajectory, prognosis, GOC, EOL wishes, disposition and options including Full code/DNI/DNR and the concept of comfort care if DNR is elected. Questions and concerns were addressed. They are  in agreement to continue current plan of care . Election for full code status.   Code status:   Code Status: Full Code   Disposition: Continue current acute care  Time spent for the meeting: 20    Andris Baumann, MD  12/18/2022, 4:21 AM

## 2022-12-18 NOTE — Assessment & Plan Note (Addendum)
Paroxysmal in nature.  Holding Eliquis due to active bleeding.  Restart low-dose Toprol-XL.  Hold Cardizem.

## 2022-12-18 NOTE — ED Notes (Signed)
Pt repositioned in bed, hospital provided breakfast tray was set up for pt. Pt has call light within reach and bed is in lowest, locked position.

## 2022-12-18 NOTE — Consult Note (Signed)
ORTHOPAEDIC CONSULTATION  REQUESTING PHYSICIAN: Alford Highland, MD  Chief Complaint: Pain and swelling right hip  HPI: Laurie Shields is a 89 y.o. female who complains of pain and swelling of the right hip.  She fell at her [facility] yesterday and landed on the right hip.  She has significant pain.  She is Eliquis for atrial fibrillation.  She developed increased swelling and was brought to the emergency room where exam and x-rays including CT scans of the hip and femur show no fractures or dislocations.  However there is a fairly large hematoma in the lateral aspect of the proximal right femur.  Patient is being admitted for observation and serial hemoglobins.  Her hemoglobin has dropped from 11.9 to 8.6.  She has been placed in Ace wrap and knee immobilizer to minimize movement.  Past Medical History:  Diagnosis Date   Atrial fibrillation    Cancer    melanoma   Melanoma    right ankle   Melanoma in situ bx 08/03/20   right upper forearm, EXC 09/08/2020, margins free   SCC (squamous cell carcinoma) 08/11/2021   R pretibia, MOHs completed 09/22/2021   SCC (squamous cell carcinoma) 08/11/2021   R upper back/base of neck, EDC 09/07/2021   Squamous cell carcinoma of skin 03/11/2019   right mid pretibia (tx with [facility] 7/20)   Past Surgical History:  Procedure Laterality Date   CHOLECYSTECTOMY     MELANOMA EXCISION     VULVECTOMY     Social History   Socioeconomic History   Marital status: Married    Spouse name: Not on file   Number of children: Not on file   Years of education: Not on file   Highest education level: Not on file  Occupational History   Not on file  Tobacco Use   Smoking status: Never    Passive exposure: Never   Smokeless tobacco: Never  Substance and Sexual Activity   Alcohol use: Not Currently   Drug use: Not Currently   Sexual activity: Not on file  Other Topics Concern   Not on file  Social History Narrative   Not on file   Social  Determinants of Health   Financial Resource Strain: Not on file  Food Insecurity: No Food Insecurity (12/18/2022)   Hunger Vital Sign    Worried About Running Out of Food in the Last Year: Never true    Ran Out of Food in the Last Year: Never true  Transportation Needs: No Transportation Needs (12/18/2022)   PRAPARE - Administrator, Civil Service (Medical): No    Lack of Transportation (Non-Medical): No  Physical Activity: Not on file  Stress: Not on file  Social Connections: Not on file   Family History  Problem Relation Age of Onset   Cancer Mother    Heart attack Mother    Alzheimer's disease Father    Breast cancer Neg Hx    Allergies  Allergen Reactions   Trazodone Other (See Comments) and Nausea Only    Blurred vision Other reaction(s): Other (See Comments) Blurred vision   Prior to Admission medications   Medication Sig Start Date End Date Taking? Authorizing Provider  acetaminophen (TYLENOL) 500 MG tablet Take 1,000 mg by mouth every 8 (eight) hours as needed for headache.   Yes [provider]  CARTIA XT 120 MG 24 hr capsule Take 120 mg by mouth 2 (two) times daily. 11/17/22  Yes [provider]  Cholecalciferol (VITAMIN D3)  50 MCG (2000 UT) capsule Take 2,000 Units by mouth daily.   Yes [provider]  clobetasol ointment (TEMOVATE) 0.05 % Apply 1 Application topically as directed. Apply to the affected area 1-2 times weekly. 11/30/22  Yes [provider]  co-enzyme Q-10 30 MG capsule Take 30 mg by mouth daily.    Yes [provider]  ELIQUIS 2.5 MG TABS tablet Take 2.5 mg by mouth daily. 11/20/18  Yes [provider]  metoprolol tartrate (LOPRESSOR) 25 MG tablet Take 1 tablet by mouth 2 (two) times daily. 12/13/22  Yes [provider]  Multiple Vitamin (MULTIVITAMIN) tablet Take 1 tablet by mouth daily.   Yes [provider]   CT FEMUR RIGHT W CONTRAST  Result Date: 12/18/2022 CLINICAL  DATA:  Fall, on Eliquis, evaluate for active extravasation EXAM: CT OF THE LOWER RIGHT EXTREMITY WITH CONTRAST TECHNIQUE: Multidetector CT imaging of the lower right extremity was performed according to the standard protocol following intravenous contrast administration. RADIATION DOSE REDUCTION: This exam was performed according to the departmental dose-optimization program which includes automated exposure control, adjustment of the mA and/or kV according to patient size and/or use of iterative reconstruction technique. CONTRAST:  OMNIPAQUE IOHEXOL 300 MG/ML  SOLN COMPARISON:  CT right hip dated 12/17/2022 FINDINGS: Active contrast extravasation along the lateral aspect of the right gluteal musculature (series 5/image 3), with adjacent low-density collection (series 5/image 29) which likely reflects a small intramuscular hematoma. Additional active extravasation lateral to the greater trochanter, at the junction of the gluteus maximus and vastus lateralis muscles (series 5/image 129). This is just medial to the subcutaneous hematoma described on right hip CT. Study is otherwise unchanged from right hip CT. IMPRESSION: Two foci of active extravasation along the right hip/lateral thigh musculature, as described above. Electronically Signed   By: Charline Bills M.D.   On: 12/18/2022 01:21   CT Hip Right Wo Contrast  Result Date: 12/17/2022 CLINICAL DATA:  Fall, right hip pain, negative radiographs EXAM: CT OF THE RIGHT HIP WITHOUT CONTRAST TECHNIQUE: Multidetector CT imaging of the right hip was performed according to the standard protocol. Multiplanar CT image reconstructions were also generated. RADIATION DOSE REDUCTION: This exam was performed according to the departmental dose-optimization program which includes automated exposure control, adjustment of the mA and/or kV according to patient size and/or use of iterative reconstruction technique. COMPARISON:  Right hip radiographs dated 12/17/2022  FINDINGS: No fracture or dislocation is seen. Right hip and visualized bony pelvis are intact. Moderate subcutaneous hematoma along the lateral right thigh, overlying the greater trochanter (series 5/image 36). Right hip joint space is preserved. Visualized soft tissue pelvis is grossly unremarkable, noting colonic diverticulosis. IMPRESSION: No acute fracture or dislocation. Moderate subcutaneous hematoma along the lateral right thigh, overlying the greater trochanter. Electronically Signed   By: Charline Bills M.D.   On: 12/17/2022 23:24   CT Head Wo Contrast  Result Date: 12/17/2022 CLINICAL DATA:  Fall, head injury, on Eliquis EXAM: CT HEAD WITHOUT CONTRAST CT CERVICAL SPINE WITHOUT CONTRAST TECHNIQUE: Multidetector CT imaging of the head and cervical spine was performed following the standard protocol without intravenous contrast. Multiplanar CT image reconstructions of the cervical spine were also generated. RADIATION DOSE REDUCTION: This exam was performed according to the departmental dose-optimization program which includes automated exposure control, adjustment of the mA and/or kV according to patient size and/or use of iterative reconstruction technique. COMPARISON:  CT head dated 07/28/2019 FINDINGS: CT HEAD FINDINGS Brain: No evidence of acute infarction,  hemorrhage, hydrocephalus, extra-axial collection or mass lesion/mass effect. Subcortical white matter and periventricular small vessel ischemic changes. Vascular: Intracranial atherosclerosis. Skull: Normal. Negative for fracture or focal lesion. Sinuses/Orbits: The visualized paranasal sinuses are essentially clear. The mastoid air cells are unopacified. Other: Small extracranial hematoma overlying the right parietal bone (series 2/image 21). CT CERVICAL SPINE FINDINGS Alignment: Normal cervical lordosis. Skull base and vertebrae: No acute fracture. No primary bone lesion or focal pathologic process. Soft tissues and spinal canal: No  prevertebral fluid or swelling. No visible canal hematoma. Disc levels: Mild degenerative changes of the mid cervical spine. Spinal canal is patent. Upper chest: Visualized lung apices are clear. Other: Visualized thyroid is unremarkable. IMPRESSION: Small extracranial hematoma overlying the right parietal bone. No evidence of calvarial fracture. No acute intracranial abnormality. Small vessel ischemic changes. No traumatic injury to the cervical spine. Mild degenerative changes. Electronically Signed   By: Charline Bills M.D.   On: 12/17/2022 23:03   CT Cervical Spine Wo Contrast  Result Date: 12/17/2022 CLINICAL DATA:  Fall, head injury, on Eliquis EXAM: CT HEAD WITHOUT CONTRAST CT CERVICAL SPINE WITHOUT CONTRAST TECHNIQUE: Multidetector CT imaging of the head and cervical spine was performed following the standard protocol without intravenous contrast. Multiplanar CT image reconstructions of the cervical spine were also generated. RADIATION DOSE REDUCTION: This exam was performed according to the departmental dose-optimization program which includes automated exposure control, adjustment of the mA and/or kV according to patient size and/or use of iterative reconstruction technique. COMPARISON:  CT head dated 07/28/2019 FINDINGS: CT HEAD FINDINGS Brain: No evidence of acute infarction, hemorrhage, hydrocephalus, extra-axial collection or mass lesion/mass effect. Subcortical white matter and periventricular small vessel ischemic changes. Vascular: Intracranial atherosclerosis. Skull: Normal. Negative for fracture or focal lesion. Sinuses/Orbits: The visualized paranasal sinuses are essentially clear. The mastoid air cells are unopacified. Other: Small extracranial hematoma overlying the right parietal bone (series 2/image 21). CT CERVICAL SPINE FINDINGS Alignment: Normal cervical lordosis. Skull base and vertebrae: No acute fracture. No primary bone lesion or focal pathologic process. Soft tissues and spinal  canal: No prevertebral fluid or swelling. No visible canal hematoma. Disc levels: Mild degenerative changes of the mid cervical spine. Spinal canal is patent. Upper chest: Visualized lung apices are clear. Other: Visualized thyroid is unremarkable. IMPRESSION: Small extracranial hematoma overlying the right parietal bone. No evidence of calvarial fracture. No acute intracranial abnormality. Small vessel ischemic changes. No traumatic injury to the cervical spine. Mild degenerative changes. Electronically Signed   By: Charline Bills M.D.   On: 12/17/2022 23:03   DG Hip Unilat W or Wo Pelvis 2-3 Views Right  Result Date: 12/17/2022 CLINICAL DATA:  Fall on right hip pain EXAM: DG HIP (WITH OR WITHOUT PELVIS) 2-3V RIGHT COMPARISON:  CT abdomen and pelvis 10/02/2022 FINDINGS: There is no evidence of hip fracture or dislocation. Degenerative changes pubic symphysis, both hips, SI joints and lower lumbar spine. Soft tissue swelling about the right hip. IMPRESSION: No acute fracture or dislocation. Electronically Signed   By: Minerva Fester M.D.   On: 12/17/2022 22:41    Positive ROS: All other systems have been reviewed and were otherwise negative with the exception of those mentioned in the HPI and as above.  Physical Exam: General: Alert, no acute distress Cardiovascular: No pedal edema Respiratory: No cyanosis, no use of accessory musculature GI: No organomegaly, abdomen is soft and non-tender Skin: No lesions in the area of chief complaint Neurologic: Sensation intact distally Psychiatric: Patient is competent for consent  with normal mood and affect Lymphatic: No axillary or cervical lymphadenopathy  MUSCULOSKELETAL: Patient is alert awake and oriented.  She is lying quietly on the emergency room stretcher.  She has a Ace bandage and knee immobilizer in place.  She has some swelling over the lateral aspect of the right hip.  No obvious bruising yet.  She is tender.  Neurovascular status good  distally.  Hip range of motion is fairly good with minimal pain.  Left lower extremity is normal.  Both upper extremities are normal and the spine is normal  Assessment: Right hip hematoma in the setting of Eliquis anticoagulation  Plan: Hold Eliquis for 2 to 3 days. Continue with knee immobilization to minimize movement. Ice to right hip continuously. PT may ambulate her weightbearing as tolerated with a walker. Follow-up in my office in 5 to 7 days.    Valinda Hoar, MD [phone]   12/18/2022 3:26 PM

## 2022-12-18 NOTE — Assessment & Plan Note (Signed)
PT and OT evaluations. ?

## 2022-12-18 NOTE — Assessment & Plan Note (Addendum)
Traumatic hematoma right hip.  Serial hemoglobins.  Transfuse as needed.  Hold Eliquis.  Pain control.  Orthopedic evaluation.

## 2022-12-18 NOTE — ED Notes (Signed)
Report given to Ashley, RN

## 2022-12-18 NOTE — Assessment & Plan Note (Signed)
Hold Eliquis. ?

## 2022-12-18 NOTE — Assessment & Plan Note (Signed)
Patient intolerant of CPAP

## 2022-12-18 NOTE — Assessment & Plan Note (Signed)
Benefits and risk of blood transfusion explained.  Will give IV iron.  Hemoglobin dropped 4 g and down to 7.8.  Patient hesitant about blood transfusion but is willing to undergo blood transfusion if hemoglobin continues to drop.  Will check at 2 PM hemoglobin.

## 2022-12-18 NOTE — Hospital Course (Signed)
89 y.o. female with medical history significant for Paroxysmal A-fib on low-dose Eliquis, OSA intolerant of CPAP, chronic exertional dyspnea, diabetes, who presents to the ED with right hip pain following an accidental fall.  Patient was leaning over to smell left lower and lost her footing falling backwards landing on her right hip and hitting the back of the head.  She had no loss of consciousness.  Apart from severe right hip pain she also sustained a skin tear on her right elbow. ED course and data review: Vitals within normal limits except For slightly elevated blood pressure.  Labs unremarkable with hemoglobin 11.9, down from 12.9 on 10/12/2022 EKG showing sinus at 62. Trauma imaging with CT head and C-spine unremarkable CT femur with contrast shows:Two foci of active extravasation along the right hip/lateral thigh musculature, as described above.  4/16.  Patient's hemoglobin down to 7.8.  Hemoglobin was 11.8 on presentation.  Patient hesitant about blood transfusion but is okay if counts continue to drop for transfusion.  Will give IV iron.  Physical therapy recommending rehab.

## 2022-12-18 NOTE — ED Notes (Signed)
Pt called out stating she had a purewick malfunction. With the help of NT Mayra, the pt's bed linens, chuck pad, brief, and gown were changed. Perineal care was performed and purewick replaced. Pt's bed was in the lowest/locked position, with call bell in reach. No other needs expressed at this time.

## 2022-12-18 NOTE — H&P (Addendum)
History and Physical    Patient: Laurie Shields OJJ:[id number] DOB: [birth year] DOA: 12/17/2022 DOS: the patient was seen and examined on 12/18/2022 PCP: Danella Penton, MD  Patient coming from: Home  Chief Complaint:  Chief Complaint  Patient presents with   Fall    On Thinners    HPI: Laurie Shields is a 89 y.o. female with medical history significant for Paroxysmal A-fib on low-dose Eliquis, OSA intolerant of CPAP, chronic exertional dyspnea, diabetes, who presents to the ED with right hip pain following an accidental fall.  Patient was leaning over to smell left lower and lost her footing falling backwards landing on her right hip and hitting the back of the head.  She had no loss of consciousness.  Apart from severe right hip pain she also sustained a skin tear on her right elbow. ED course and data review: Vitals within normal limits except For slightly elevated blood pressure.  Labs unremarkable with hemoglobin 11.9, down from 12.9 on 10/12/2022 EKG showing sinus at 62. Trauma imaging with CT head and C-spine unremarkable CT femur with contrast shows:Two foci of active extravasation along the right hip/lateral thigh musculature, as described above  Patient was treated with fentanyl for pain. The ED provider: Spoke with orthopedist, Dr. Hyacinth Meeker will see patient in the a.m.  He ordered Ace wrap, knee immobilizer and extremity elevation and serial H&H. Hospitalist consulted for admission.   Review of Systems: As mentioned in the history of present illness. All other systems reviewed and are negative.  Past Medical History:  Diagnosis Date   Atrial fibrillation    Cancer    melanoma   Melanoma    right ankle   Melanoma in situ bx 08/03/20   right upper forearm, EXC 09/08/2020, margins free   SCC (squamous cell carcinoma) 08/11/2021   R pretibia, MOHs completed 09/22/2021   SCC (squamous cell carcinoma) 08/11/2021   R upper back/base of neck, EDC 09/07/2021   Squamous cell carcinoma of  skin 03/11/2019   right mid pretibia (tx with [facility] 7/20)   Past Surgical History:  Procedure Laterality Date   CHOLECYSTECTOMY     MELANOMA EXCISION     VULVECTOMY     Social History:  reports that she has never smoked. She has never been exposed to tobacco smoke. She has never used smokeless tobacco. She reports that she does not currently use alcohol. She reports that she does not currently use drugs.  Allergies  Allergen Reactions   Trazodone Other (See Comments) and Nausea Only    Blurred vision Other reaction(s): Other (See Comments) Blurred vision    Family History  Problem Relation Age of Onset   Cancer Mother    Heart attack Mother    Alzheimer's disease Father    Breast cancer Neg Hx     Prior to Admission medications   Medication Sig Start Date End Date Taking? Authorizing Provider  Cholecalciferol (VITAMIN D3) 50 MCG (2000 UT) capsule Take 2,000 Units by mouth daily.    [provider]  citalopram (CELEXA) 10 MG tablet Take 10 mg by mouth daily. 09/05/22   [provider]  co-enzyme Q-10 30 MG capsule Take 30 mg by mouth daily.     [provider]  dicyclomine (BENTYL) 10 MG capsule Take 10 mg by mouth 4 (four) times daily -  before meals and at bedtime.    [provider]  diltiazem (DILACOR XR) 240 MG 24 hr capsule Take 240 mg by mouth daily.  01/21/19   [provider]  ELIQUIS 2.5 MG TABS tablet Take 2.5 mg by mouth daily. 11/20/18   [provider]  metoprolol tartrate (LOPRESSOR) 25 MG tablet Take 25 mg by mouth daily. 06/12/19   [provider]  Multiple Vitamin (MULTIVITAMIN) tablet Take 1 tablet by mouth daily.    [provider]    Physical Exam: Vitals:   12/17/22 2145 12/17/22 2148 12/17/22 2330 12/18/22 0151  BP: (!) 158/98  134/63 (!) 94/48  Pulse: 70  63 68  Resp: Temp: (!) 97.5 F (36.4 C)   98.4 F (36.9 C)  TempSrc: Oral   Oral  SpO2: 96%  94% 93%  Weight:  56.2  kg    Height:   (1.626 m)     Physical Exam Vitals and nursing note reviewed.  Constitutional:      General: She is not in acute distress. HENT:     Head: Normocephalic and atraumatic.  Cardiovascular:     Rate and Rhythm: Normal rate and regular rhythm.     Heart sounds: Normal heart sounds.  Pulmonary:     Effort: Pulmonary effort is normal.     Breath sounds: Normal breath sounds.  Abdominal:     Palpations: Abdomen is soft.     Tenderness: There is no abdominal tenderness.  Neurological:     Mental Status: Mental status is at baseline.     Labs on Admission: I have personally reviewed following labs and imaging studies  CBC: Recent Labs  Lab 12/17/22 2215  WBC 9.4  NEUTROABS 6.7  HGB 11.9*  HCT 36.9  MCV 93.7  PLT 223   Basic Metabolic Panel: Recent Labs  Lab 12/17/22 2215  NA 135  K 4.2  CL 102  CO2 28  GLUCOSE 96  BUN 26*  CREATININE 0.98  CALCIUM 9.4   GFR: Estimated Creatinine Clearance: 34.9 mL/min (by C-G formula based on SCr of 0.98 mg/dL). Liver Function Tests: Recent Labs  Lab 12/17/22 2215  AST 25  ALT 18  ALKPHOS 84  BILITOT 0.8  PROT 6.6  ALBUMIN 4.1   No results for input(s): "LIPASE", "AMYLASE" in the last 168 hours. No results for input(s): "AMMONIA" in the last 168 hours. Coagulation Profile: Recent Labs  Lab 12/17/22 2215  INR 1.1   Cardiac Enzymes: No results for input(s): "CKTOTAL", "CKMB", "CKMBINDEX", "TROPONINI" in the last 168 hours. BNP (last 3 results) No results for input(s): "PROBNP" in the last 8760 hours. HbA1C: No results for input(s): "HGBA1C" in the last 72 hours. CBG: No results for input(s): "GLUCAP" in the last 168 hours. Lipid Profile: No results for input(s): "CHOL", "HDL", "LDLCALC", "TRIG", "CHOLHDL", "LDLDIRECT" in the last 72 hours. Thyroid Function Tests: No results for input(s): "TSH", "T4TOTAL", "FREET4", "T3FREE", "THYROIDAB" in the last 72 hours. Anemia Panel: No results for  input(s): "VITAMINB12", "FOLATE", "FERRITIN", "TIBC", "IRON", "RETICCTPCT" in the last 72 hours. Urine analysis:    Component Value Date/Time   COLORURINE COLORLESS (A) 06/29/2019 0112   APPEARANCEUR CLEAR (A) 06/29/2019 0112   LABSPEC 1.002 (L) 06/29/2019 0112   PHURINE 7.0 06/29/2019 0112   GLUCOSEU NEGATIVE 06/29/2019 0112   HGBUR NEGATIVE 06/29/2019 0112   BILIRUBINUR NEGATIVE 06/29/2019 0112   KETONESUR NEGATIVE 06/29/2019 0112   PROTEINUR NEGATIVE 06/29/2019 0112   NITRITE NEGATIVE 06/29/2019 0112   LEUKOCYTESUR TRACE (A) 06/29/2019 0112    Radiological Exams on Admission: CT FEMUR RIGHT W CONTRAST  Result Date: 12/18/2022  CLINICAL DATA:  Fall, on Eliquis, evaluate for active extravasation EXAM: CT OF THE LOWER RIGHT EXTREMITY WITH CONTRAST TECHNIQUE: Multidetector CT imaging of the lower right extremity was performed according to the standard protocol following intravenous contrast administration. RADIATION DOSE REDUCTION: This exam was performed according to the departmental dose-optimization program which includes automated exposure control, adjustment of the mA and/or kV according to patient size and/or use of iterative reconstruction technique. CONTRAST:  OMNIPAQUE IOHEXOL 300 MG/ML  SOLN COMPARISON:  CT right hip dated 12/17/2022 FINDINGS: Active contrast extravasation along the lateral aspect of the right gluteal musculature (series 5/image 3), with adjacent low-density collection (series 5/image 29) which likely reflects a small intramuscular hematoma. Additional active extravasation lateral to the greater trochanter, at the junction of the gluteus maximus and vastus lateralis muscles (series 5/image 129). This is just medial to the subcutaneous hematoma described on right hip CT. Study is otherwise unchanged from right hip CT. IMPRESSION: Two foci of active extravasation along the right hip/lateral thigh musculature, as described above. Electronically Signed   By: Charline Bills M.D.   On: 12/18/2022 01:21   CT Hip Right Wo Contrast  Result Date: 12/17/2022 CLINICAL DATA:  Fall, right hip pain, negative radiographs EXAM: CT OF THE RIGHT HIP WITHOUT CONTRAST TECHNIQUE: Multidetector CT imaging of the right hip was performed according to the standard protocol. Multiplanar CT image reconstructions were also generated. RADIATION DOSE REDUCTION: This exam was performed according to the departmental dose-optimization program which includes automated exposure control, adjustment of the mA and/or kV according to patient size and/or use of iterative reconstruction technique. COMPARISON:  Right hip radiographs dated 12/17/2022 FINDINGS: No fracture or dislocation is seen. Right hip and visualized bony pelvis are intact. Moderate subcutaneous hematoma along the lateral right thigh, overlying the greater trochanter (series 5/image 36). Right hip joint space is preserved. Visualized soft tissue pelvis is grossly unremarkable, noting colonic diverticulosis. IMPRESSION: No acute fracture or dislocation. Moderate subcutaneous hematoma along the lateral right thigh, overlying the greater trochanter. Electronically Signed   By: Charline Bills M.D.   On: 12/17/2022 23:24   CT Head Wo Contrast  Result Date: 12/17/2022 CLINICAL DATA:  Fall, head injury, on Eliquis EXAM: CT HEAD WITHOUT CONTRAST CT CERVICAL SPINE WITHOUT CONTRAST TECHNIQUE: Multidetector CT imaging of the head and cervical spine was performed following the standard protocol without intravenous contrast. Multiplanar CT image reconstructions of the cervical spine were also generated. RADIATION DOSE REDUCTION: This exam was performed according to the departmental dose-optimization program which includes automated exposure control, adjustment of the mA and/or kV according to patient size and/or use of iterative reconstruction technique. COMPARISON:  CT head dated 07/28/2019 FINDINGS: CT HEAD FINDINGS Brain: No evidence of acute  infarction, hemorrhage, hydrocephalus, extra-axial collection or mass lesion/mass effect. Subcortical white matter and periventricular small vessel ischemic changes. Vascular: Intracranial atherosclerosis. Skull: Normal. Negative for fracture or focal lesion. Sinuses/Orbits: The visualized paranasal sinuses are essentially clear. The mastoid air cells are unopacified. Other: Small extracranial hematoma overlying the right parietal bone (series 2/image 21). CT CERVICAL SPINE FINDINGS Alignment: Normal cervical lordosis. Skull base and vertebrae: No acute fracture. No primary bone lesion or focal pathologic process. Soft tissues and spinal canal: No prevertebral fluid or swelling. No visible canal hematoma. Disc levels: Mild degenerative changes of the mid cervical spine. Spinal canal is patent. Upper chest: Visualized lung apices are clear. Other: Visualized thyroid is unremarkable. IMPRESSION: Small extracranial hematoma overlying the right parietal bone. No evidence of calvarial  fracture. No acute intracranial abnormality. Small vessel ischemic changes. No traumatic injury to the cervical spine. Mild degenerative changes. Electronically Signed   By: Charline Bills M.D.   On: 12/17/2022 23:03   CT Cervical Spine Wo Contrast  Result Date: 12/17/2022 CLINICAL DATA:  Fall, head injury, on Eliquis EXAM: CT HEAD WITHOUT CONTRAST CT CERVICAL SPINE WITHOUT CONTRAST TECHNIQUE: Multidetector CT imaging of the head and cervical spine was performed following the standard protocol without intravenous contrast. Multiplanar CT image reconstructions of the cervical spine were also generated. RADIATION DOSE REDUCTION: This exam was performed according to the departmental dose-optimization program which includes automated exposure control, adjustment of the mA and/or kV according to patient size and/or use of iterative reconstruction technique. COMPARISON:  CT head dated 07/28/2019 FINDINGS: CT HEAD FINDINGS Brain: No  evidence of acute infarction, hemorrhage, hydrocephalus, extra-axial collection or mass lesion/mass effect. Subcortical white matter and periventricular small vessel ischemic changes. Vascular: Intracranial atherosclerosis. Skull: Normal. Negative for fracture or focal lesion. Sinuses/Orbits: The visualized paranasal sinuses are essentially clear. The mastoid air cells are unopacified. Other: Small extracranial hematoma overlying the right parietal bone (series 2/image 21). CT CERVICAL SPINE FINDINGS Alignment: Normal cervical lordosis. Skull base and vertebrae: No acute fracture. No primary bone lesion or focal pathologic process. Soft tissues and spinal canal: No prevertebral fluid or swelling. No visible canal hematoma. Disc levels: Mild degenerative changes of the mid cervical spine. Spinal canal is patent. Upper chest: Visualized lung apices are clear. Other: Visualized thyroid is unremarkable. IMPRESSION: Small extracranial hematoma overlying the right parietal bone. No evidence of calvarial fracture. No acute intracranial abnormality. Small vessel ischemic changes. No traumatic injury to the cervical spine. Mild degenerative changes. Electronically Signed   By: Charline Bills M.D.   On: 12/17/2022 23:03   DG Hip Unilat W or Wo Pelvis 2-3 Views Right  Result Date: 12/17/2022 CLINICAL DATA:  Fall on right hip pain EXAM: DG HIP (WITH OR WITHOUT PELVIS) 2-3V RIGHT COMPARISON:  CT abdomen and pelvis 10/02/2022 FINDINGS: There is no evidence of hip fracture or dislocation. Degenerative changes pubic symphysis, both hips, SI joints and lower lumbar spine. Soft tissue swelling about the right hip. IMPRESSION: No acute fracture or dislocation. Electronically Signed   By: Minerva Fester M.D.   On: 12/17/2022 22:41     Data Reviewed: Relevant notes from primary care and specialist visits, past discharge summaries as available in EHR, including Care Everywhere. Prior diagnostic testing as pertinent to  current admission diagnoses Updated medications and problem lists for reconciliation ED course, including vitals, labs, imaging, treatment and response to treatment Triage notes, nursing and pharmacy notes and ED provider's notes Notable results as noted in HPI   Assessment and Plan: * Hematoma of thigh, right, initial encounter Acute blood loss anemia in the setting of chronic anticoagulation Accidental fall Hemoglobin 11.9, down from 12.9 a couple months prior  CT femur with contrast showing active extravasation Pain control Recommendations per Ortho consult from the ED, Dr. Hyacinth Meeker - Hold Eliquis - Serial H&H - Knee immobilizer, Ace wrap and keep leg elevated Will additionally hold antihypertensives Will give a fluid bolus  Addendum Hb 11.9->9.3.Marland KitchenMarland Kitchen Patient otherwise hemodynamically stable, awake alert and conversant on reassessment Continue to monitor closely Discussed transfusion  with patient and husband at bedside and they are agreeable in the event transfusion is needed    12/18/2022    3:30 AM 12/18/2022    2:30 AM 12/18/2022    2:01 AM  Vitals with  BMI  Systolic 152 110 85  Diastolic 65 71 50  Pulse 69 67 67      OSA on CPAP Patient intolerant of CPAP  Atrial fibrillation Holding Eliquis due to active bleeding Will hold diltiazem and metoprolol to stave off hypotension in the setting of active bleeding    CRITICAL CARE Performed by: Andris Baumann   Total critical care time: 90 minutes  Critical care time was exclusive of separately billable procedures and treating other patients.  Critical care was necessary to treat or prevent imminent or life-threatening deterioration.  Critical care was time spent personally by me on the following activities: development of treatment plan with patient and/or surrogate as well as nursing, discussions with consultants, evaluation of patient's response to treatment, examination of patient, obtaining history from patient  or surrogate, ordering and performing treatments and interventions, ordering and review of laboratory studies, ordering and review of radiographic studies, pulse oximetry and re-evaluation of patient's condition.     DVT prophylaxis: SCD  Consults: Orthopedics, Dr. Hyacinth Meeker  Advance Care Planning: full code  Family Communication: Husband at bedside  Disposition Plan: Back to previous home environment  Severity of Illness: The appropriate patient status for this patient is INPATIENT. Inpatient status is judged to be reasonable and necessary in order to provide the required intensity of service to ensure the patient's safety. The patient's presenting symptoms, physical exam findings, and initial radiographic and laboratory data in the context of their chronic comorbidities is felt to place them at high risk for further clinical deterioration. Furthermore, it is not anticipated that the patient will be medically stable for discharge from the hospital within 2 midnights of admission.   * I certify that at the point of admission it is my clinical judgment that the patient will require inpatient hospital care spanning beyond 2 midnights from the point of admission due to high intensity of service, high risk for further deterioration and high frequency of surveillance required.*  Author: Andris Baumann, MD 12/18/2022 2:02 AM  For on call review [email or web address].

## 2022-12-19 ENCOUNTER — Encounter: Payer: Medicare Other | Admitting: Dermatology

## 2022-12-19 DIAGNOSIS — D62 Acute posthemorrhagic anemia: Secondary | ICD-10-CM | POA: Diagnosis not present

## 2022-12-19 DIAGNOSIS — S7001XS Contusion of right hip, sequela: Secondary | ICD-10-CM

## 2022-12-19 DIAGNOSIS — G4733 Obstructive sleep apnea (adult) (pediatric): Secondary | ICD-10-CM

## 2022-12-19 DIAGNOSIS — S7011XS Contusion of right thigh, sequela: Secondary | ICD-10-CM | POA: Diagnosis not present

## 2022-12-19 DIAGNOSIS — Z7901 Long term (current) use of anticoagulants: Secondary | ICD-10-CM | POA: Diagnosis not present

## 2022-12-19 LAB — COMPREHENSIVE METABOLIC PANEL
ALT: 57 U/L — ABNORMAL HIGH (ref 0–44)
AST: 48 U/L — ABNORMAL HIGH (ref 15–41)
Albumin: 2.9 g/dL — ABNORMAL LOW (ref 3.5–5.0)
Alkaline Phosphatase: 66 U/L (ref 38–126)
Anion gap: 4 — ABNORMAL LOW (ref 5–15)
BUN: 20 mg/dL (ref 8–23)
CO2: 27 mmol/L (ref 22–32)
Calcium: 7.9 mg/dL — ABNORMAL LOW (ref 8.9–10.3)
Chloride: 105 mmol/L (ref 98–111)
Creatinine, Ser: 0.76 mg/dL (ref 0.44–1.00)
GFR, Estimated: >60 mL/min (ref >60)
Glucose, Bld: 101 mg/dL — ABNORMAL HIGH (ref 70–99)
Potassium: 4.2 mmol/L (ref 3.5–5.1)
Sodium: 136 mmol/L (ref 135–145)
Total Bilirubin: 0.6 mg/dL (ref 0.3–1.2)
Total Protein: 4.8 g/dL — ABNORMAL LOW (ref 6.5–8.1)

## 2022-12-19 LAB — CBC
HCT: 24 % — ABNORMAL LOW (ref 36.0–46.0)
Hemoglobin: 7.8 g/dL — ABNORMAL LOW (ref 12.0–15.0)
MCH: 30.7 pg (ref 26.0–34.0)
MCHC: 32.5 g/dL (ref 30.0–36.0)
MCV: 94.5 fL (ref 80.0–100.0)
Platelets: 151 10*3/uL (ref 150–400)
RBC: 2.54 MIL/uL — ABNORMAL LOW (ref 3.87–5.11)
RDW: 13.5 % (ref 11.5–15.5)
WBC: 5.2 10*3/uL (ref 4.0–10.5)
nRBC: 0 % (ref 0.0–0.2)

## 2022-12-19 LAB — HEMOGLOBIN: Hemoglobin: 7.9 g/dL — ABNORMAL LOW (ref 12.0–15.0)

## 2022-12-19 MED ORDER — SODIUM CHLORIDE 0.9 % IV SOLN
300.0000 mg | Freq: Once | INTRAVENOUS | Status: AC
  Administered 2022-12-19: 300 mg via INTRAVENOUS
  Filled 2022-12-19: qty 300

## 2022-12-19 NOTE — Progress Notes (Signed)
Mobility Specialist - Progress Note    12/19/22 1134  Mobility  Activity Stood at bedside;Dangled on edge of bed;Ambulated with assistance in hallway  Level of Assistance Contact guard assist, steadying assist  Assistive Device Front wheel walker  Distance Ambulated (ft) 20 ft  Range of Motion/Exercises Active  RLE Weight Bearing WBAT  Activity Response Tolerated well  Mobility Referral Yes  $Mobility charge 1 Mobility   Pt resting in bed on 2L upon entry. Pt STS MinA and ambulates to hallway outside of door CGA. Pt endorses pain on right side but, still agreeable to ambulation. Pt took x1 standing rest break before returning in to bed. Pt left with needs in reach and bed alarm activated. Family friend present in room.   Johnathan Hausen Mobility Specialist 12/19/22, 11:40 AM

## 2022-12-19 NOTE — Evaluation (Signed)
Occupational Therapy Evaluation Patient Details Name: Kayce Chismar MRN: [id number] DOB: May 19, 1933 Today's Date: 12/19/2022   History of Present Illness Patient is a 89 year old female with traumatic right hip hematoma following fall. Patient placed in a knee immobilizer to minimize movement. History of Paroxysmal A-fib on low-dose Eliquis, OSA intolerant of CPAP, chronic exertional dyspnea, diabetes.   Clinical Impression   Patient presenting with decreased Ind in self care, balance, functional mobility/transfers, endurance, and safety awareness. Patient reports being Ind at baseline and lives with husband in [place]. She drives and is very active and enjoys weekly line dancing and yoga. Pt needing cues throughout session for breathing techniques as she appears to be holding breath with mobility and in anticipation of pain.  Patient currently functioning at min - mod A for functional mobility with RW and self care needs. Patient will benefit from acute OT to increase overall independence in the areas of ADLs, functional mobility, and safety awareness in order to safely discharge.     Recommendations for follow up therapy are one component of a multi-disciplinary discharge planning process, led by the attending physician.  Recommendations may be updated based on patient status, additional functional criteria and insurance authorization.   Assistance Recommended at Discharge Intermittent Supervision/Assistance  Patient can return home with the following A little help with walking and/or transfers;A little help with bathing/dressing/bathroom;Assistance with cooking/housework;Assist for transportation    Functional Status Assessment  Patient has had a recent decline in their functional status and demonstrates the ability to make significant improvements in function in a reasonable and predictable amount of time.  Equipment Recommendations  Other (comment) (defer to next venue of care)        Precautions / Restrictions Precautions Precautions: Fall Required Braces or Orthoses: Knee Immobilizer - Right Knee Immobilizer - Right: Other (comment);On except when in CPM;On at all times (Can be removed for self care needs) Restrictions Weight Bearing Restrictions: Yes RLE Weight Bearing: Weight bearing as tolerated      Mobility Bed Mobility Overal bed mobility: Needs Assistance Bed Mobility: Supine to Sit, Sit to Supine     Supine to sit: Min assist Sit to supine: Min assist   General bed mobility comments: Pt needing assistance for R LE    Transfers Overall transfer level: Needs assistance Equipment used: Rolling walker (2 wheels) Transfers: Sit to/from Stand, Bed to chair/wheelchair/BSC Sit to Stand: Min assist     Step pivot transfers: Min assist            Balance Overall balance assessment: Needs assistance Sitting-balance support: Feet supported Sitting balance-Leahy Scale: Good     Standing balance support: Bilateral upper extremity supported, During functional activity, Reliant on assistive device for balance Standing balance-Leahy Scale: Poor Standing balance comment: Min A                           ADL either performed or assessed with clinical judgement   ADL Overall ADL's : Needs assistance/impaired     Grooming: Wash/dry hands;Wash/dry face;Sitting;Set up                   Toilet Transfer: Minimal assistance;BSC/3in1;Rolling walker (2 wheels)                   Vision Baseline Vision/History: 1 Wears glasses Patient Visual Report: No change from baseline              Pertinent Vitals/Pain Pain Assessment  Pain Assessment: Faces Faces Pain Scale: Hurts little more Pain Location: R hip with movement Pain Descriptors / Indicators: Grimacing, Guarding, Sore, Discomfort Pain Intervention(s): Ice applied, Repositioned, Premedicated before session, Monitored during session     Hand Dominance Right    Extremity/Trunk Assessment Upper Extremity Assessment Upper Extremity Assessment: Overall WFL for tasks assessed   Lower Extremity Assessment Lower Extremity Assessment: Defer to PT evaluation       Communication Communication Communication: No difficulties   Cognition Arousal/Alertness: Awake/alert Behavior During Therapy: WFL for tasks assessed/performed Overall Cognitive Status: Within Functional Limits for tasks assessed                                 General Comments: Pt is pleasant and cooperative     General Comments  patient reports feeling hot and mildly dizzy with upright activity.            Home Living Family/patient expects to be discharged to:: Private residence ([facility] - [place]) Living Arrangements: Spouse/significant other Available Help at Discharge: Family Type of Home: Independent living facility Home Access: Level entry     Home Layout: One level     Bathroom Shower/Tub: Chief Strategy Officer: Handicapped height     Home Equipment: Shower seat;Grab bars - toilet;Grab bars - tub/shower          Prior Functioning/Environment Prior Level of Function : Independent/Modified Independent;Driving             Mobility Comments: independent. goes to Yoga class 2 days per week ADLs Comments: Pt enjoys yoga and line dancing at [facility]        OT Problem List: Decreased activity tolerance;Decreased safety awareness;Impaired balance (sitting and/or standing);Decreased knowledge of use of DME or AE;Decreased strength;Pain      OT Treatment/Interventions: Self-care/ADL training;Therapeutic exercise;Therapeutic activities;Energy conservation;DME and/or AE instruction;Patient/family education;Balance training    OT Goals(Current goals can be found in the care plan section) Acute Rehab OT Goals Patient Stated Goal: to get stronger and decrease pain OT Goal Formulation: With patient Time For Goal Achievement:  01/02/23 Potential to Achieve Goals: Good ADL Goals Pt Will Perform Grooming: with supervision;standing Pt Will Perform Lower Body Dressing: with supervision;sit to/from stand;with adaptive equipment Pt Will Transfer to Toilet: with supervision;ambulating Pt Will Perform Toileting - Clothing Manipulation and hygiene: with supervision;sit to/from stand  OT Frequency: Min 1X/week       AM-PAC OT "6 Clicks" Daily Activity     Outcome Measure Help from another person eating meals?: None Help from another person taking care of personal grooming?: None Help from another person toileting, which includes using toliet, bedpan, or urinal?: A Lot Help from another person bathing (including washing, rinsing, drying)?: A Lot Help from another person to put on and taking off regular upper body clothing?: None Help from another person to put on and taking off regular lower body clothing?: A Lot 6 Click Score: 18   End of Session Equipment Utilized During Treatment: Rolling walker (2 wheels) Nurse Communication: Mobility status  Activity Tolerance: Patient tolerated treatment well Patient left: in bed;with call bell/phone within reach;with bed alarm set  OT Visit Diagnosis: Unsteadiness on feet (R26.81);Muscle weakness (generalized) (M62.81);Repeated falls (R29.6);Pain Pain - Right/Left: Right Pain - part of body: Leg;Hip                Time: 1610-9604 OT Time Calculation (min): 20 min Charges:  OT General Charges $OT Visit: 1 Visit OT Evaluation $OT Eval Moderate Complexity: 1 Mod OT Treatments $Self Care/Home Management : 8-22 mins  Jackquline Denmark, MS, OTR/L , CBIS ascom [phone]  12/19/22, 11:52 AM

## 2022-12-19 NOTE — ED Notes (Signed)
PT placed on hospital bed for comfort, she states she is much more comfortable.  No other needs identified at this time.

## 2022-12-19 NOTE — Progress Notes (Signed)
Progress Note   Patient: Laurie Shields ZOX:[id number] DOB: April 26, 1933 DOA: 12/17/2022     0 DOS: the patient was seen and examined on 12/19/2022   Brief hospital course:  89 y.o. female with medical history significant for Paroxysmal A-fib on low-dose Eliquis, OSA intolerant of CPAP, chronic exertional dyspnea, diabetes, who presents to the ED with right hip pain following an accidental fall.  Patient was leaning over to smell left lower and lost her footing falling backwards landing on her right hip and hitting the back of the head.  She had no loss of consciousness.  Apart from severe right hip pain she also sustained a skin tear on her right elbow. ED course and data review: Vitals within normal limits except For slightly elevated blood pressure.  Labs unremarkable with hemoglobin 11.9, down from 12.9 on 10/12/2022 EKG showing sinus at 62. Trauma imaging with CT head and C-spine unremarkable CT femur with contrast shows:Two foci of active extravasation along the right hip/lateral thigh musculature, as described above.  4/16.  Patient's hemoglobin down to 7.8.  Hemoglobin was 11.8 on presentation.  Patient hesitant about blood transfusion but is okay if counts continue to drop for transfusion.  Will give IV iron.  Physical therapy recommending rehab.  Assessment and Plan: * Traumatic hematoma of right thigh Traumatic hematoma right hip.  Serial hemoglobins.  Transfuse as needed.  Hold Eliquis.  Pain control.  Orthopedic evaluation appreciated.  ABLA (acute blood loss anemia) Benefits and risk of blood transfusion explained.  Will give IV iron.  Hemoglobin dropped 4 g and down to 7.8.  Patient hesitant about blood transfusion but is willing to undergo blood transfusion if hemoglobin continues to drop.  Will check at 2 PM hemoglobin.  Chronic anticoagulation Hold Eliquis  Accidental fall PT and OT evaluations appreciated and they recommend rehab.  OSA on CPAP Patient intolerant of  CPAP  Atrial fibrillation Paroxysmal in nature.  Holding Eliquis due to active bleeding.  Restart low-dose Toprol-XL.  Hold Cardizem.        Subjective: Patient stated she was very weak with physical therapy.  She dropped 4 g of hemoglobin with bleeding into her hip and thigh.  Patient was a little hesitant about blood transfusion and wanted me to check another hemoglobin.  Physical Exam: Vitals:   12/19/22 1045 12/19/22 1100 12/19/22 1115 12/19/22 1216  BP:  (!) 146/62  126/60  Pulse: 80 79 89 77  Resp: Temp:    98.3 F (36.8 C)  TempSrc:      SpO2: 100% 99% 98% 100%  Weight:      Height:       Physical Exam HENT:     Head: Normocephalic.     Mouth/Throat:     Pharynx: No oropharyngeal exudate.  Eyes:     General: Lids are normal.     Conjunctiva/sclera: Conjunctivae normal.  Cardiovascular:     Rate and Rhythm: Normal rate and regular rhythm.     Heart sounds: Normal heart sounds, S1 normal and S2 normal.  Pulmonary:     Breath sounds: No decreased breath sounds, wheezing, rhonchi or rales.  Abdominal:     Palpations: Abdomen is soft.     Tenderness: There is no abdominal tenderness.  Musculoskeletal:     Right hip: Tenderness present.     Right lower leg: No swelling.     Left lower leg: No swelling.     Comments: Right leg wrapped with Ace bandage  and a brace.  Skin:    General: Skin is warm.     Findings: No rash.  Neurological:     Mental Status: She is alert and oriented to person, place, and time.     Comments: Able to wiggle toes on right side and flex and extend at the ankle.  Able to straight leg raise today.     Data Reviewed: Hemoglobin down to 7.8.  Will check another hemoglobin at around 2 PM and transfuse if lower.  Family Communication: Tried to reach husband at [phone] but unable to reach.  Disposition: Status is: Observation Physical therapy recommending rehab.  Will get transitional care team to evaluate.  Will check  another hemoglobin at 2 PM if lower I will give a blood transfusion.  Check pulse ox on room air.  Planned Discharge Destination: Patient lives at [facility] and hopefully can go to rehab.    Time spent: 28 minutes  Author: Alford Highland, MD 12/19/2022 1:06 PM  For on call review [email or web address].

## 2022-12-19 NOTE — ED Notes (Signed)
Patient given a breakfast tray.

## 2022-12-19 NOTE — ED Notes (Signed)
Pt was easily woken for morning blood work.  She reports no pain at this time but "you have not moved me yet."  She states she got some decent rest.  No needs identified at this time.

## 2022-12-19 NOTE — ED Notes (Signed)
Mobility specialist at the bedside

## 2022-12-19 NOTE — Plan of Care (Signed)

## 2022-12-19 NOTE — Evaluation (Signed)
Physical Therapy Evaluation Patient Details Name: Laurie Shields MRN: [id number] DOB: [birth year] Today's Date: 12/19/2022  History of Present Illness  Patient is a 89 year old female with traumatic right hip hematoma following fall. Patient placed in a knee immobilizer to minimize movement. History of Paroxysmal A-fib on low-dose Eliquis, OSA intolerant of CPAP, chronic exertional dyspnea, diabetes.  Clinical Impression  Patient was agreeable to PT and eager to get out of bed. She is typically independent at baseline and lives with her spouse at [facility].  Today the patient reports mild to moderate pain in the right hip with mobility. Mobility training initiated with knee immobilizer in place. The patient required assistance for bed mobility and transfers. Standing activity tolerance limited by fatigue with minimal activity. Blood pressure after standing was 98/59 and SP02 remained in the 90's on room air with activity. Recommend to continue PT to maximize independence and facilitate return to prior level of function.    Recommendations for follow up therapy are one component of a multi-disciplinary discharge planning process, led by the attending physician.  Recommendations may be updated based on patient status, additional functional criteria and insurance authorization.  Follow Up Recommendations Can patient physically be transported by private vehicle: No     Assistance Recommended at Discharge Intermittent Supervision/Assistance  Patient can return home with the following  A lot of help with walking and/or transfers;A little help with bathing/dressing/bathroom;Assistance with cooking/housework;Assist for transportation;Help with stairs or ramp for entrance    Equipment Recommendations Rolling walker (2 wheels)  Recommendations for Other Services       Functional Status Assessment Patient has had a recent decline in their functional status and demonstrates the ability to make significant  improvements in function in a reasonable and predictable amount of time.     Precautions / Restrictions Precautions Precautions: Fall Required Braces or Orthoses: Knee Immobilizer - Right Knee Immobilizer - Right: Other (comment) (At all times except when in CPM/PT and when providing other necessary care) Restrictions Weight Bearing Restrictions: Yes RLE Weight Bearing: Weight bearing as tolerated      Mobility  Bed Mobility Overal bed mobility: Needs Assistance Bed Mobility: Supine to Sit, Sit to Supine     Supine to sit: Min assist Sit to supine: Mod assist   General bed mobility comments: assistance required for RLE support. verbal cues for technique. pain with ROM of R hip    Transfers Overall transfer level: Needs assistance Equipment used: Rolling walker (2 wheels) Transfers: Sit to/from Stand Sit to Stand: Mod assist           General transfer comment: lifting and lowering assistance provided to stand. cues for technique    Ambulation/Gait Ambulation/Gait assistance: Min assist Gait Distance (Feet): 5 Feet Assistive device: Rolling walker (2 wheels) Gait Pattern/deviations: Step-to pattern, Decreased stance time - right, Antalgic Gait velocity: decreased     General Gait Details: verbal cues for rolling walker and BLE sequencing as well as technique to use rolling walker to off load RLE for comfort with weight bearing.  Stairs            Wheelchair Mobility    Modified Rankin (Stroke Patients Only)       Balance Overall balance assessment: Needs assistance Sitting-balance support: Feet supported Sitting balance-Leahy Scale: Good     Standing balance support: Bilateral upper extremity supported, During functional activity, Reliant on assistive device for balance Standing balance-Leahy Scale: Poor Standing balance comment: occasional Min A required  Pertinent Vitals/Pain Pain Assessment Pain  Assessment: Faces Faces Pain Scale: Hurts little more Pain Location: R hip with movement Pain Descriptors / Indicators: Grimacing, Guarding, Sore Pain Intervention(s): Limited activity within patient's tolerance, Monitored during session, Repositioned, Ice applied    Home Living Family/patient expects to be discharged to:: Private residence Living Arrangements: Spouse/significant other Available Help at Discharge: Family Type of Home:  ([place] [facility]) Home Access: Level entry       Home Layout: One level Home Equipment: Shower seat;Grab bars - toilet;Grab bars - tub/shower      Prior Function Prior Level of Function : Independent/Modified Independent             Mobility Comments: independent. goes to Yoga class 2 days per week       Hand Dominance        Extremity/Trunk Assessment   Upper Extremity Assessment Upper Extremity Assessment: Overall WFL for tasks assessed    Lower Extremity Assessment Lower Extremity Assessment:  (RLE in knee immobilizer. pain with AROM of hip)       Communication   Communication: No difficulties  Cognition Arousal/Alertness: Awake/alert Behavior During Therapy: WFL for tasks assessed/performed Overall Cognitive Status: Within Functional Limits for tasks assessed                                          General Comments General comments (skin integrity, edema, etc.): patient reports feeling hot and mildly dizzy with upright activity.    Exercises     Assessment/Plan    PT Assessment Patient needs continued PT services  PT Problem List Decreased strength;Decreased range of motion;Decreased activity tolerance;Decreased balance;Decreased mobility;Pain;Decreased safety awareness;Decreased knowledge of precautions       PT Treatment Interventions DME instruction;Gait training;Stair training;Functional mobility training;Therapeutic activities;Therapeutic exercise;Neuromuscular re-education;Balance  training;Patient/family education    PT Goals (Current goals can be found in the Care Plan section)  Acute Rehab PT Goals Patient Stated Goal: to go to [facility] short term rehab PT Goal Formulation: With patient Time For Goal Achievement: 01/02/23 Potential to Achieve Goals: Good    Frequency Min 4X/week     Co-evaluation               AM-PAC PT "6 Clicks" Mobility  Outcome Measure Help needed turning from your back to your side while in a flat bed without using bedrails?: A Little Help needed moving from lying on your back to sitting on the side of a flat bed without using bedrails?: A Lot Help needed moving to and from a bed to a chair (including a wheelchair)?: A Lot Help needed standing up from a chair using your arms (e.g., wheelchair or bedside chair)?: A Lot Help needed to walk in hospital room?: A Little Help needed climbing 3-5 steps with a railing? : A Lot 6 Click Score: 14    End of Session Equipment Utilized During Treatment: Gait belt Activity Tolerance: Patient limited by fatigue;Patient limited by pain Patient left: in bed;with call bell/phone within reach;with bed alarm set Nurse Communication: Mobility status PT Visit Diagnosis: Other abnormalities of gait and mobility (R26.89);Difficulty in walking, not elsewhere classified (R26.2)    Time: 4098-1191 PT Time Calculation (min) (ACUTE ONLY): 29 min   Charges:   PT Evaluation $PT Eval Low Complexity: 1 Low PT Treatments $Gait Training: 8-22 mins        Donna Bernard, PT,  MPT   Ina Homes 12/19/2022, 10:05 AM

## 2022-12-19 NOTE — ED Notes (Signed)
Pt resting no signs of distress noted at this time.

## 2022-12-20 DIAGNOSIS — I480 Paroxysmal atrial fibrillation: Secondary | ICD-10-CM

## 2022-12-20 DIAGNOSIS — S7011XA Contusion of right thigh, initial encounter: Secondary | ICD-10-CM | POA: Diagnosis not present

## 2022-12-20 DIAGNOSIS — D62 Acute posthemorrhagic anemia: Secondary | ICD-10-CM

## 2022-12-20 LAB — BPAM RBC
Blood Product Expiration Date: 202404172359
Blood Product Expiration Date: 202405202359
Blood Product Expiration Date: 202405212359
ISSUE DATE / TIME: 202404151243
Unit Type and Rh: 9500

## 2022-12-20 LAB — VITAMIN B12: Vitamin B-12: 300 pg/mL (ref 180–914)

## 2022-12-20 LAB — TYPE AND SCREEN
ABO/RH(D): O POS
Antibody Screen: NEGATIVE
Unit division: 0
Unit division: 0
Unit division: 0

## 2022-12-20 LAB — PREPARE RBC (CROSSMATCH)

## 2022-12-20 MED ORDER — CYANOCOBALAMIN 1000 MCG/ML IJ SOLN
1000.0000 ug | Freq: Once | INTRAMUSCULAR | Status: AC
  Administered 2022-12-20: 1000 ug via INTRAMUSCULAR
  Filled 2022-12-20: qty 1

## 2022-12-20 MED ORDER — LACTULOSE 10 GM/15ML PO SOLN
20.0000 g | Freq: Once | ORAL | Status: AC
  Administered 2022-12-20: 20 g via ORAL
  Filled 2022-12-20: qty 30

## 2022-12-20 NOTE — TOC Progression Note (Signed)
Transition of Care [facility]) - Progression Note    Patient Details  Name: Laurie Shields MRN: [id number] Date of Birth: [birth year]  Transition of Care [facility] CM/SW Contact  Marlowe Sax, RN Phone Number: 12/20/2022, 10:42 AM  Clinical Narrative:    Met with the patient and her husband in the room They tell me that they have a villa at [facility] She plans to go to [facility] for STR prior to returning home I contacted Sue Lush at [place] and left a secure VM asking for a bed at [facility] Awaiting a call back, PASSR obtained FL2 completed, Sent referral thru the Hub        Expected Discharge Plan and Services                                               Social Determinants of Health (SDOH) Interventions SDOH Screenings   Food Insecurity: No Food Insecurity (12/18/2022)  Housing: Low Risk  (12/18/2022)  Transportation Needs: No Transportation Needs (12/18/2022)  Utilities: Not At Risk (12/18/2022)  Tobacco Use: Low Risk  (12/18/2022)    Readmission Risk Interventions     No data to display

## 2022-12-20 NOTE — Progress Notes (Signed)
Occupational Therapy Treatment Patient Details Name: Laurie Shields MRN: [id number] DOB: 1933-08-26 Today's Date: 12/20/2022   History of present illness Patient is a 89 year old female with traumatic right hip hematoma following fall. Patient placed in a knee immobilizer to minimize movement. History of Paroxysmal A-fib on low-dose Eliquis, OSA intolerant of CPAP, chronic exertional dyspnea, diabetes.   OT comments  Patient received supine in bed and agreeable to OT. Spouse present. Tx session targeted improving tolerance for functional mobility in the setting of ADL tasks. Pt able to come to EOB with supervision this date and stood from EOB with Min guard. Attempting functional mobility to the sink for standing grooming tasks, however, pt endorsed having a "hot flash" at rest that turned into dizziness/nausea upon standing. Pt required seated rest break at EOB and OT retrieving monitor to check BP (98/65 in sitting). Pt agreeable to grooming tasks while sitting at EOB. Pt able to complete lateral scooting along EOB before returning to supine. Pt left as received with all needs in reach. Pt is making progress toward goal completion. D/C recommendation remains appropriate. OT will continue to follow acutely.     Recommendations for follow up therapy are one component of a multi-disciplinary discharge planning process, led by the attending physician.  Recommendations may be updated based on patient status, additional functional criteria and insurance authorization.    Assistance Recommended at Discharge Intermittent Supervision/Assistance  Patient can return home with the following  A little help with walking and/or transfers;A little help with bathing/dressing/bathroom;Assistance with cooking/housework;Assist for transportation   Equipment Recommendations  Other (comment) (defer to next venue of care)    Recommendations for Other Services      Precautions / Restrictions Precautions Precautions:  Fall Required Braces or Orthoses: Knee Immobilizer - Right Knee Immobilizer - Right: Other (comment);On except when in CPM;On at all times Restrictions Weight Bearing Restrictions: Yes RLE Weight Bearing: Weight bearing as tolerated       Mobility Bed Mobility Overal bed mobility: Needs Assistance Bed Mobility: Supine to Sit, Sit to Supine     Supine to sit: Supervision, HOB elevated Sit to supine: Supervision        Transfers Overall transfer level: Needs assistance Equipment used: Rolling walker (2 wheels) Transfers: Sit to/from Stand, Bed to chair/wheelchair/BSC Sit to Stand: Min guard          Lateral/Scoot Transfers: Supervision General transfer comment: STS from EOB; lateral scooting along EOB toward R side     Balance Overall balance assessment: Needs assistance Sitting-balance support: Feet supported Sitting balance-Leahy Scale: Good     Standing balance support: Bilateral upper extremity supported, During functional activity, Reliant on assistive device for balance Standing balance-Leahy Scale: Fair           ADL either performed or assessed with clinical judgement   ADL Overall ADL's : Needs assistance/impaired     Grooming: Set up;Sitting;Wash/dry face;Oral care                   Toilet Transfer: Rolling walker (2 wheels);Min guard Toilet Transfer Details (indicate cue type and reason): simulated                Extremity/Trunk Assessment              Vision Baseline Vision/History: 1 Wears glasses Patient Visual Report: No change from baseline     Perception     Praxis      Cognition Arousal/Alertness: Awake/alert Behavior During Therapy: [facility] for  tasks assessed/performed Overall Cognitive Status: Within Functional Limits for tasks assessed            Exercises      Shoulder Instructions       General Comments Pt endorsed having a "hot flash" and dizzy/nauseous with activity this date. BP sitting EOB 98/65  (MAP 75), HR 91. BP once returned to supine 117/56 (MAP 75), HR 80.    Pertinent Vitals/ Pain       Pain Assessment Pain Assessment: Faces Faces Pain Scale: Hurts a little bit Pain Location: R hip with movement Pain Descriptors / Indicators: Grimacing, Guarding, Sore, Discomfort Pain Intervention(s): Monitored during session, Repositioned  Home Living         Prior Functioning/Environment              Frequency  Min 1X/week        Progress Toward Goals  OT Goals(current goals can now be found in the care plan section)  Progress towards OT goals: Progressing toward goals  Acute Rehab OT Goals Patient Stated Goal: to get stronger and decrease pain OT Goal Formulation: With patient Time For Goal Achievement: 01/02/23 Potential to Achieve Goals: Good  Plan Discharge plan remains appropriate;Frequency remains appropriate    Co-evaluation                 AM-PAC OT "6 Clicks" Daily Activity     Outcome Measure   Help from another person eating meals?: None Help from another person taking care of personal grooming?: None Help from another person toileting, which includes using toliet, bedpan, or urinal?: A Lot Help from another person bathing (including washing, rinsing, drying)?: A Lot Help from another person to put on and taking off regular upper body clothing?: None Help from another person to put on and taking off regular lower body clothing?: A Lot 6 Click Score: 18    End of Session Equipment Utilized During Treatment: Rolling walker (2 wheels)  OT Visit Diagnosis: Unsteadiness on feet (R26.81);Muscle weakness (generalized) (M62.81);Repeated falls (R29.6);Pain Pain - Right/Left: Right Pain - part of body: Leg;Hip   Activity Tolerance Other (comment) (2/2 dizziness and nausea)   Patient Left in bed;with call bell/phone within reach;with bed alarm set   Nurse Communication Mobility status;Other (comment) (BP)        Time: 1030-1049 OT Time  Calculation (min): 19 min  Charges: OT General Charges $OT Visit: 1 Visit OT Treatments $Self Care/Home Management : 8-22 mins  [facility] MS, OTR/L ascom [phone]  12/20/22, 1:10 PM

## 2022-12-20 NOTE — Plan of Care (Signed)
  Problem: Clinical Measurements: Goal: Will remain free from infection Outcome: Progressing   Problem: Clinical Measurements: Goal: Diagnostic test results will improve Outcome: Progressing   Problem: Activity: Goal: Risk for activity intolerance will decrease Outcome: Progressing   Problem: Coping: Goal: Level of anxiety will decrease Outcome: Progressing   

## 2022-12-20 NOTE — Progress Notes (Signed)
  Progress Note   Patient: Laurie Shields ZOX:[id number] DOB: 03-Aug-1933 DOA: 12/17/2022     0 DOS: the patient was seen and examined on 12/20/2022   Brief hospital course:  89 y.o. female with medical history significant for Paroxysmal A-fib on low-dose Eliquis, OSA intolerant of CPAP, chronic exertional dyspnea, diabetes, who presents to the ED with right hip pain following an accidental fall.  Patient was leaning over to smell left lower and lost her footing falling backwards landing on her right hip and hitting the back of the head.  She had no loss of consciousness.  Apart from severe right hip pain she also sustained a skin tear on her right elbow. ED course and data review: Vitals within normal limits except For slightly elevated blood pressure.  Labs unremarkable with hemoglobin 11.9, down from 12.9 on 10/12/2022 EKG showing sinus at 62. Trauma imaging with CT head and C-spine unremarkable CT femur with contrast shows:Two foci of active extravasation along the right hip/lateral thigh musculature, as described above.  4/16.  Patient's hemoglobin down to 7.8.  Hemoglobin was 11.8 on presentation.  Patient hesitant about blood transfusion but is okay if counts continue to drop for transfusion.  Will give IV iron.  Physical therapy recommending rehab.   Principal Problem:   Traumatic hematoma of right thigh Active Problems:   ABLA (acute blood loss anemia)   Chronic anticoagulation   Accidental fall   Atrial fibrillation   OSA on CPAP   Traumatic hematoma of right hip   Hematoma of right hip, sequela   Assessment and Plan: * Traumatic hematoma of right thigh Acute blood loss anemia. Patient condition appears to be stabilizing, hemoglobin has been stable after giving IV iron yesterday.  Recheck hemoglobin tomorrow.  Patient currently pending nursing home placement. Borderline B12 level at 300, check homocystine level.  Give a dose of injection of B12.  Atrial fibrillation with Chronic  anticoagulation Hold Eliquis due to acute bleeding.  Accidental fall Pending nursing home placement.  OSA on CPAP Patient intolerant of CPAP       Subjective:  Patient doing well today, she has no complaint today.  Physical Exam: Vitals:   12/19/22 1216 12/19/22 1600 12/19/22 2230 12/19/22 2235  BP: 126/60 128/74 (!) 149/91 124/63  Pulse: 77 80 86 85  Resp: Temp: 98.3 F (36.8 C) 98.2 F (36.8 C) 99.1 F (37.3 C) 99.1 F (37.3 C)  TempSrc:      SpO2: 100% 98% 96% 96%  Weight:      Height:       General exam: Appears calm and comfortable  Respiratory system: Clear to auscultation. Respiratory effort normal. Cardiovascular system: S1 & S2 heard, RRR. No JVD, murmurs, rubs, gallops or clicks. No pedal edema. Gastrointestinal system: Abdomen is nondistended, soft and nontender. No organomegaly or masses felt. Normal bowel sounds heard. Central nervous system: Alert and oriented. No focal neurological deficits. Extremities: Right thigh swelling. Skin: No rashes, lesions or ulcers Psychiatry: Judgement and insight appear normal. Mood & affect appropriate.    Data Reviewed:  Lab results reviewed.  Family Communication: None  Disposition: Status is: Observation      Time spent: 35 minutes  Author: Marrion Coy, MD 12/20/2022 1:26 PM  For on call review [email or web address].

## 2022-12-20 NOTE — NC FL2 (Signed)
[place] MEDICAID FL2 LEVEL OF CARE FORM     IDENTIFICATION  Patient Name: Laurie Shields Birthdate: 1933-03-26 Sex: female Admission Date (Current Location): 12/17/2022  [facility] and [place] Number:  Chiropodist and Address:  [facility], [address], [place], [place] [address]      Provider Number: [id number]  Attending Physician Name and Address:  Marrion Coy, MD  Relative Name and Phone Number:  Jillene Bucks husband [phone]    Current Level of Care: Hospital Recommended Level of Care: Skilled Nursing Facility Prior Approval Number:    Date Approved/Denied:   PASRR Number: [id number]  Discharge Plan: SNF    Current Diagnoses: Patient Active Problem List   Diagnosis Date Noted   Hematoma of right hip, sequela 12/19/2022   Chronic anticoagulation 12/18/2022   Accidental fall 12/18/2022   ABLA (acute blood loss anemia) 12/18/2022   Traumatic hematoma of right thigh 12/18/2022   Traumatic hematoma of right hip 12/18/2022   Aortic mural thrombus 05/23/2022   Renal artery stenosis 05/23/2022   Celiac artery stenosis 05/09/2022   Atrial fibrillation 05/09/2022   OSA on CPAP 07/22/2019    Orientation RESPIRATION BLADDER Height & Weight     Self, Time, Situation, Place  Normal Continent Weight: 56.2 kg Height:   (162.6 cm)  BEHAVIORAL SYMPTOMS/MOOD NEUROLOGICAL BOWEL NUTRITION STATUS      Continent Diet (See DC summary)  AMBULATORY STATUS COMMUNICATION OF NEEDS Skin   Extensive Assist Verbally Normal, Bruising, Other (Comment) (skin tear on her right elbow,right hip hematoma)                       Personal Care Assistance Level of Assistance  Bathing, Feeding, Dressing Bathing Assistance: Limited assistance Feeding assistance: Independent Dressing Assistance: Limited assistance     Functional Limitations Info  Sight, Hearing, Speech Sight Info: Adequate Hearing Info: Adequate Speech Info: Adequate     SPECIAL CARE FACTORS FREQUENCY  PT (By licensed PT), OT (By licensed OT)     PT Frequency: 5 times per week OT Frequency: 5 times per week            Contractures Contractures Info: Not present    Additional Factors Info  Code Status, Allergies Code Status Info: full code Allergies Info: trazadone           Current Medications (12/20/2022):  This is the current hospital active medication list Current Facility-Administered Medications  Medication Dose Route Frequency Provider Last Rate Last Admin   0.9 %  sodium chloride infusion (Manually program via Guardrails IV Fluids)   Intravenous Once Andris Baumann, MD       acetaminophen (TYLENOL) tablet 650 mg  650 mg Oral Q6H PRN Andris Baumann, MD   650 mg at 12/20/22 0501   Or   acetaminophen (TYLENOL) suppository 650 mg  650 mg Rectal Q6H PRN Andris Baumann, MD       bacitracin ointment   Topical BID [name], MD   Given at 12/19/22 2232   HYDROcodone-acetaminophen (NORCO/VICODIN) 5-325 MG per tablet 1 tablet  1 tablet Oral Q4H PRN Alford Highland, MD   1 tablet at 12/18/22 2255   metoprolol succinate (TOPROL-XL) 24 hr tablet 25 mg  25 mg Oral Daily Alford Highland, MD   25 mg at 12/20/22 1015   multivitamin with minerals tablet 1 tablet  1 tablet Oral Daily Alford Highland, MD   1 tablet at 12/20/22 1015   ondansetron [facility])  tablet 4 mg  4 mg Oral Q6H PRN Andris Baumann, MD       Or   ondansetron [facility]) injection 4 mg  4 mg Intravenous Q6H PRN Andris Baumann, MD   4 mg at 12/20/22 1014     Discharge Medications: Please see discharge summary for a list of discharge medications.  Relevant Imaging Results:  Relevant Lab Results:   Additional Information [id number]  Marlowe Sax, RN

## 2022-12-20 NOTE — Progress Notes (Signed)
Physical Therapy Treatment Patient Details Name: Laurie Shields MRN: [id number] DOB: Jan 04, 1933 Today's Date: 12/20/2022   History of Present Illness Patient is a 89 year old female with traumatic right hip hematoma following fall. Patient placed in a knee immobilizer to minimize movement. History of Paroxysmal A-fib on low-dose Eliquis, OSA intolerant of CPAP, chronic exertional dyspnea, diabetes.    PT Comments    Pt received upright in bed agreeable to PT. Pt has KI donned exiting bed at supervision level with bed features. Pt able to stand with minguard to RW and ambulate ~60' today. Pt does rely on seated rest after 40' due to onset of feeling diaphoretic. VSS upon assessment in sitting with pt returning back to room sitting EOB then endorsing feeling nauseous. RN notified with NT present assisting pt to bathroom for BM. Continue to recommend f/u PT services to address acute deficits to optimize return to PLOF.    Recommendations for follow up therapy are one component of a multi-disciplinary discharge planning process, led by the attending physician.  Recommendations may be updated based on patient status, additional functional criteria and insurance authorization.  Follow Up Recommendations  Can patient physically be transported by private vehicle: No    Assistance Recommended at Discharge Intermittent Supervision/Assistance  Patient can return home with the following A lot of help with walking and/or transfers;A little help with bathing/dressing/bathroom;Assistance with cooking/housework;Assist for transportation;Help with stairs or ramp for entrance   Equipment Recommendations  Rolling walker (2 wheels)    Recommendations for Other Services       Precautions / Restrictions Precautions Precautions: Fall Required Braces or Orthoses: Knee Immobilizer - Right Knee Immobilizer - Right: Other (comment);On except when in CPM;On at all times Restrictions Weight Bearing Restrictions:  No RLE Weight Bearing: Weight bearing as tolerated     Mobility  Bed Mobility Overal bed mobility: Needs Assistance Bed Mobility: Supine to Sit     Supine to sit: Supervision, HOB elevated       Patient Response: Cooperative  Transfers Overall transfer level: Needs assistance Equipment used: Rolling walker (2 wheels) Transfers: Sit to/from Stand Sit to Stand: Min guard                Ambulation/Gait Ambulation/Gait assistance: Min guard Gait Distance (Feet): 60 Feet Assistive device: Rolling walker (2 wheels) Gait Pattern/deviations: Step-to pattern, Decreased stance time - right, Antalgic       General Gait Details: requires seated rest during bout   Stairs             Wheelchair Mobility    Modified Rankin (Stroke Patients Only)       Balance Overall balance assessment: Needs assistance Sitting-balance support: Feet supported Sitting balance-Leahy Scale: Good     Standing balance support: Bilateral upper extremity supported, During functional activity, Reliant on assistive device for balance Standing balance-Leahy Scale: Fair Standing balance comment: light UE support on RW                            Cognition Arousal/Alertness: Awake/alert Behavior During Therapy: WFL for tasks assessed/performed Overall Cognitive Status: Within Functional Limits for tasks assessed                                          Exercises      General Comments General comments (skin integrity, edema, etc.):  hot, nauseous and dizzy today with gait.      Pertinent Vitals/Pain Pain Assessment Pain Assessment: Faces Faces Pain Scale: Hurts a little bit Pain Location: R hip with movement Pain Descriptors / Indicators: Grimacing, Guarding, Sore, Discomfort Pain Intervention(s): Repositioned, Monitored during session    Home Living                          Prior Function            PT Goals (current goals can  now be found in the care plan section) Acute Rehab PT Goals Patient Stated Goal: to go to [facility] short term rehab PT Goal Formulation: With patient Time For Goal Achievement: 01/02/23 Potential to Achieve Goals: Good Progress towards PT goals: Progressing toward goals    Frequency    Min 4X/week      PT Plan Current plan remains appropriate    Co-evaluation              AM-PAC PT "6 Clicks" Mobility   Outcome Measure  Help needed turning from your back to your side while in a flat bed without using bedrails?: A Little Help needed moving from lying on your back to sitting on the side of a flat bed without using bedrails?: A Little Help needed moving to and from a bed to a chair (including a wheelchair)?: A Little Help needed standing up from a chair using your arms (e.g., wheelchair or bedside chair)?: A Lot Help needed to walk in hospital room?: A Little Help needed climbing 3-5 steps with a railing? : A Lot 6 Click Score: 16    End of Session Equipment Utilized During Treatment: Gait belt Activity Tolerance: Patient limited by fatigue Patient left: with nursing/sitter in room;with family/visitor present (in bathroom with NT.) Nurse Communication: Mobility status PT Visit Diagnosis: Other abnormalities of gait and mobility (R26.89);Difficulty in walking, not elsewhere classified (R26.2)     Time: 4098-1191 PT Time Calculation (min) (ACUTE ONLY): 27 min  Charges:  $Gait Training: 23-37 mins                     Delphia Grates. Fairly IV, PT, DPT Physical Therapist- [facility]  [facility]  12/20/2022, 12:21 PM

## 2022-12-20 NOTE — Progress Notes (Signed)
Subjective:   Patient is comfortable.  Hemoglobin stabilized.  Has been out of bed.  She does have quite a bit of swelling and ecchymosis in the right hip region.    Patient reports pain as moderate.  Objective:   VITALS:   Vitals:   12/19/22 2230 12/19/22 2235  BP: (!) 149/91 124/63  Pulse: 86 85  Resp: 18 20  Temp: 99.1 F (37.3 C) 99.1 F (37.3 C)  SpO2: 96% 96%    Neurologically intact Intact pulses distally Dorsiflexion/Plantar flexion intact  LABS Recent Labs    12/17/22 2215 12/18/22 0223 12/18/22 1812 12/19/22 0417 12/19/22 1412  HGB 11.9*   < > 8.6* 7.8* 7.9*  HCT 36.9  --   --  24.0*  --   WBC 9.4  --   --  5.2  --   PLT 223  --   --  151  --    < > = values in this interval not displayed.    Recent Labs    12/17/22 2215 12/19/22 0417  NA 135 136  K 4.2 4.2  BUN 26* 20  CREATININE 0.98 0.76  GLUCOSE 96 101*    Recent Labs    12/17/22 2215  INR 1.1     Assessment/Plan:      Up with therapy Discharge home with home health if possible. Hold anticoagulants for 1-2 more days. Follow-up in my office in 5 to 7 days. May weight-bear as tolerated with a walker.

## 2022-12-20 NOTE — Plan of Care (Signed)

## 2022-12-21 DIAGNOSIS — I480 Paroxysmal atrial fibrillation: Secondary | ICD-10-CM | POA: Diagnosis not present

## 2022-12-21 DIAGNOSIS — D62 Acute posthemorrhagic anemia: Secondary | ICD-10-CM | POA: Diagnosis not present

## 2022-12-21 DIAGNOSIS — S7011XA Contusion of right thigh, initial encounter: Secondary | ICD-10-CM | POA: Diagnosis not present

## 2022-12-21 LAB — HEMOGLOBIN: Hemoglobin: 8.4 g/dL — ABNORMAL LOW (ref 12.0–15.0)

## 2022-12-21 MED ORDER — PANTOPRAZOLE SODIUM 40 MG PO TBEC
40.0000 mg | DELAYED_RELEASE_TABLET | Freq: Every day | ORAL | Status: DC
  Administered 2022-12-22: 40 mg via ORAL
  Filled 2022-12-21 (×2): qty 1

## 2022-12-21 MED ORDER — SENNOSIDES-DOCUSATE SODIUM 8.6-50 MG PO TABS
2.0000 | ORAL_TABLET | Freq: Two times a day (BID) | ORAL | Status: DC
  Administered 2022-12-21 – 2022-12-22 (×3): 2 via ORAL
  Filled 2022-12-21 (×3): qty 2

## 2022-12-21 NOTE — Progress Notes (Signed)
Physical Therapy Treatment Patient Details Name: Laurie Shields MRN: [id number] DOB: Dec 12, 1932 Today's Date: 12/21/2022   History of Present Illness Patient is a 88 year old female with traumatic right hip hematoma following fall. Patient placed in a knee immobilizer to minimize movement. History of Paroxysmal A-fib on low-dose Eliquis, OSA intolerant of CPAP, chronic exertional dyspnea, diabetes.    PT Comments    Pt was long sitting in bed upon arrival. She is A and O. Agreeable to session and OOB activity. BP in bed 127/70(80), in sitting 106/77, standing EOB 97/56(68). Pt did not endorse any symptoms of dizziness or hypotensive concerns. After static standing for ~ 3 minutes BP elevated to 116/77. Session progressed away from EOB. Pt tolerated gait ~ 75 ft with RW without LOB however does c/o fatigue/ LBP. Pt is progressing. Acute PT will continue to follow and progress per current POC.    Recommendations for follow up therapy are one component of a multi-disciplinary discharge planning process, led by the attending physician.  Recommendations may be updated based on patient status, additional functional criteria and insurance authorization.     Assistance Recommended at Discharge Intermittent Supervision/Assistance  Patient can return home with the following A little help with walking and/or transfers;A little help with bathing/dressing/bathroom;Assistance with cooking/housework;Direct supervision/assist for medications management;Direct supervision/assist for financial management;Assist for transportation;Help with stairs or ramp for entrance   Equipment Recommendations  Rolling walker (2 wheels)       Precautions / Restrictions Precautions Precautions: Fall Required Braces or Orthoses: Knee Immobilizer - Right Restrictions Weight Bearing Restrictions: Yes RLE Weight Bearing: Weight bearing as tolerated     Mobility  Bed Mobility Overal bed mobility: Needs Assistance Bed  Mobility: Supine to Sit, Sit to Supine  Supine to sit: Supervision, HOB elevated Sit to supine: Supervision     Transfers Overall transfer level: Needs assistance Equipment used: Rolling walker (2 wheels) Transfers: Sit to/from Stand Sit to Stand: Min guard  General transfer comment: increased time/effort with KI in proper positioning    Ambulation/Gait Ambulation/Gait assistance: Min guard Gait Distance (Feet): 75 Feet Assistive device: Rolling walker (2 wheels) Gait Pattern/deviations: Step-to pattern, Decreased stance time - right, Antalgic Gait velocity: decreased  General Gait Details: pt was able to ambulate ~ 75 ft with RW. no LOB or c/o dizziness. Pt ebndorses less pain this afternoon versus morning session   Balance Overall balance assessment: Needs assistance Sitting-balance support: Feet supported Sitting balance-Leahy Scale: Good     Standing balance support: Single extremity supported, Bilateral upper extremity supported, During functional activity Standing balance-Leahy Scale: Fair       Cognition Arousal/Alertness: Awake/alert Behavior During Therapy: WFL for tasks assessed/performed Overall Cognitive Status: Within Functional Limits for tasks assessed  General Comments: Pt is pleasant and cooperative               Pertinent Vitals/Pain Pain Assessment Pain Assessment: 0-10 Pain Score: 2  Faces Pain Scale: Hurts a little bit Pain Location: low back/ R hip Pain Descriptors / Indicators: Discomfort Pain Intervention(s): Limited activity within patient's tolerance, Monitored during session, Premedicated before session, Repositioned     PT Goals (current goals can now be found in the care plan section) Acute Rehab PT Goals Patient Stated Goal: Go back to [facility] Progress towards PT goals: Progressing toward goals    Frequency    Min 4X/week      PT Plan Current plan remains appropriate    AM-PAC PT "6 Clicks" Mobility   Outcome  Measure  Help needed turning from your back to your side while in a flat bed without using bedrails?: A Little Help needed moving from lying on your back to sitting on the side of a flat bed without using bedrails?: A Little Help needed moving to and from a bed to a chair (including a wheelchair)?: A Little Help needed standing up from a chair using your arms (e.g., wheelchair or bedside chair)?: A Little Help needed to walk in hospital room?: A Little Help needed climbing 3-5 steps with a railing? : A Lot 6 Click Score: 17    End of Session   Activity Tolerance: Patient tolerated treatment well Patient left: in bed;with call bell/phone within reach;with bed alarm set Nurse Communication: Mobility status PT Visit Diagnosis: Other abnormalities of gait and mobility (R26.89);Difficulty in walking, not elsewhere classified (R26.2)     Time: 1610-9604 PT Time Calculation (min) (ACUTE ONLY): 18 min  Charges:  $Gait Training: 8-22 mins                     Jetta Lout PTA 12/21/22, 4:50 PM

## 2022-12-21 NOTE — Progress Notes (Signed)
Progress Note   Patient: Laurie Shields ZOX:[id number] DOB: [birth year] DOA: 12/17/2022     0 DOS: the patient was seen and examined on 12/21/2022   Brief hospital course:  89 y.o. female with medical history significant for Paroxysmal A-fib on low-dose Eliquis, OSA intolerant of CPAP, chronic exertional dyspnea, diabetes, who presents to the ED with right hip pain following an accidental fall.  Patient was leaning over to smell left lower and lost her footing falling backwards landing on her right hip and hitting the back of the head.  She had no loss of consciousness.  Apart from severe right hip pain she also sustained a skin tear on her right elbow. ED course and data review: Vitals within normal limits except For slightly elevated blood pressure.  Labs unremarkable with hemoglobin 11.9, down from 12.9 on 10/12/2022 EKG showing sinus at 62. Trauma imaging with CT head and C-spine unremarkable CT femur with contrast shows:Two foci of active extravasation along the right hip/lateral thigh musculature, as described above.  4/16.  Patient's hemoglobin down to 7.8.  Hemoglobin was 11.8 on presentation.  Patient hesitant about blood transfusion but is okay if counts continue to drop for transfusion.  Will give IV iron.  Physical therapy recommending rehab.   Principal Problem:   Traumatic hematoma of right thigh Active Problems:   ABLA (acute blood loss anemia)   Chronic anticoagulation   Accidental fall   Paroxysmal atrial fibrillation   OSA on CPAP   Traumatic hematoma of right hip   Hematoma of right hip, sequela   Assessment and Plan:  Traumatic hematoma of right thigh Acute blood loss anemia. Patient condition appears to be stabilizing, hemoglobin has been stable after giving IV iron. Borderline B12 level at 300, check homocystine level.  Give a dose of injection of B12 x2.  Homocystine level pending, continue to follow hemoglobin.   Atrial fibrillation with Chronic anticoagulation Hold  Eliquis due to acute bleeding.   Accidental fall Pending nursing home placement.   OSA on CPAP Patient intolerant of CPAP  Nausea vomiting. Patient has having nausea after a walk with physical therapy.  She had a small bowel movement earlier this morning, will start senna.  Also add Protonix.    Subjective:  Patient still complaining of pain with walking, nausea and vomiting after working with physical therapy.  No abdominal pain.  Had a small bowel movement this morning.  Physical Exam: Vitals:   12/20/22 1714 12/20/22 2332 12/21/22 0829 12/21/22 0829  BP: 109/67 126/82 123/65 123/65  Pulse: 90 (!) 103 95 95  Resp: Temp: 98.6 F (37 C) 98.2 F (36.8 C)  98.6 F (37 C)  TempSrc:      SpO2: 97% 93%  97%  Weight:      Height:       General exam: Appears calm and comfortable  Respiratory system: Clear to auscultation. Respiratory effort normal. Cardiovascular system: S1 & S2 heard, RRR. No JVD, murmurs, rubs, gallops or clicks. No pedal edema. Gastrointestinal system: Abdomen is nondistended, soft and nontender. No organomegaly or masses felt. Normal bowel sounds heard. Central nervous system: Alert and oriented. No focal neurological deficits. Extremities: Symmetric 5 x 5 power. Skin: No rashes, lesions or ulcers Psychiatry: Judgement and insight appear normal. Mood & affect appropriate.    Data Reviewed:  Lab results reviewed.  Family Communication: None  Disposition: Status is: Observation      Time spent: 35 minutes  Author: Marrion Coy, MD  12/21/2022 12:19 PM  For on call review [email or web address].

## 2022-12-21 NOTE — Plan of Care (Signed)

## 2022-12-21 NOTE — TOC Transition Note (Addendum)
Transition of Care [facility]) - CM/SW Discharge Note   Patient Details  Name: Laurie Shields MRN: [id number] Date of Birth: 07-10-1933  Transition of Care [facility]) CM/SW Contact:  Marlowe Sax, RN Phone Number: 12/21/2022, 10:30 AM   Clinical Narrative:    Ins Berkley Harvey is pending to go to [facility] for Rehab ref number [id number]   Final next level of care: Skilled Nursing Facility Barriers to Discharge: Insurance Authorization   Patient Goals and CMS Choice      Discharge Placement                         Discharge Plan and Services Additional resources added to the After Visit Summary for                                       Social Determinants of Health (SDOH) Interventions SDOH Screenings   Food Insecurity: No Food Insecurity (12/18/2022)  Housing: Low Risk  (12/18/2022)  Transportation Needs: No Transportation Needs (12/18/2022)  Utilities: Not At Risk (12/18/2022)  Tobacco Use: Low Risk  (12/18/2022)     Readmission Risk Interventions     No data to display

## 2022-12-21 NOTE — Progress Notes (Signed)
Occupational Therapy Treatment Patient Details Name: Laurie Shields MRN: [id number] DOB: 1933/07/18 Today's Date: 12/21/2022   History of present illness Patient is a 89 year old female with traumatic right hip hematoma following fall. Patient placed in a knee immobilizer to minimize movement. History of Paroxysmal A-fib on low-dose Eliquis, OSA intolerant of CPAP, chronic exertional dyspnea, diabetes.   OT comments  Pt seen for OT tx. Pt pleasant, agreeable. Endorses back pain but does not worsen with mobility. Pt endorsed nausea and "hot flash" a few times while she's been up walking with staff. Orthostatics taken. Sitting BP 99/61, standing BP 81/56, standing 94/62. HR 89-105. After being up for ~5-98min she began to feel nauseated which improved once she sat down. Pt educated in falls prevention in relation to symptoms with positional changes and instructed in KI mgt. MOD A for KI mgt, MIN A for underwear/pants. Pt continues to benefit from skilled OT services.    Recommendations for follow up therapy are one component of a multi-disciplinary discharge planning process, led by the attending physician.  Recommendations may be updated based on patient status, additional functional criteria and insurance authorization.    Assistance Recommended at Discharge Intermittent Supervision/Assistance  Patient can return home with the following  A little help with walking and/or transfers;A little help with bathing/dressing/bathroom;Assistance with cooking/housework;Assist for transportation   Equipment Recommendations  Other (comment) (defer)    Recommendations for Other Services      Precautions / Restrictions Precautions Precautions: Fall Required Braces or Orthoses: Knee Immobilizer - Right Restrictions Weight Bearing Restrictions: Yes RLE Weight Bearing: Weight bearing as tolerated       Mobility Bed Mobility               General bed mobility comments: NT in recliner     Transfers Overall transfer level: Needs assistance Equipment used: Rolling walker (2 wheels) Transfers: Sit to/from Stand Sit to Stand: Min guard           General transfer comment: increased time/effort with KI in proper positioning     Balance Overall balance assessment: Needs assistance Sitting-balance support: Feet supported Sitting balance-Leahy Scale: Good     Standing balance support: Single extremity supported, Bilateral upper extremity supported, During functional activity Standing balance-Leahy Scale: Fair                             ADL either performed or assessed with clinical judgement   ADL Overall ADL's : Needs assistance/impaired                     Lower Body Dressing: Sit to/from stand;Minimal assistance;Moderate assistance Lower Body Dressing Details (indicate cue type and reason): MOD A for KI mgt, MIN A for underwear/pants                    Extremity/Trunk Assessment              Vision       Perception     Praxis      Cognition Arousal/Alertness: Awake/alert Behavior During Therapy: WFL for tasks assessed/performed Overall Cognitive Status: Within Functional Limits for tasks assessed                                          Exercises Other Exercises Other Exercises: Pt educated  in falls prevention and symptomatic positional changes, KI mgt    Shoulder Instructions       General Comments      Pertinent Vitals/ Pain       Pain Assessment Pain Assessment: 0-10 Pain Score: 6  Pain Location: low back Pain Descriptors / Indicators: Aching Pain Intervention(s): Limited activity within patient's tolerance, Monitored during session, Patient requesting pain meds-RN notified, Repositioned  Home Living                                          Prior Functioning/Environment              Frequency  Min 1X/week        Progress Toward Goals  OT  Goals(current goals can now be found in the care plan section)  Progress towards OT goals: Progressing toward goals  Acute Rehab OT Goals Patient Stated Goal: get stronger and less pain OT Goal Formulation: With patient Time For Goal Achievement: 01/02/23 Potential to Achieve Goals: Good  Plan Discharge plan remains appropriate;Frequency remains appropriate    Co-evaluation                 AM-PAC OT "6 Clicks" Daily Activity     Outcome Measure   Help from another person eating meals?: None Help from another person taking care of personal grooming?: None Help from another person toileting, which includes using toliet, bedpan, or urinal?: A Lot Help from another person bathing (including washing, rinsing, drying)?: A Lot Help from another person to put on and taking off regular upper body clothing?: None Help from another person to put on and taking off regular lower body clothing?: A Little 6 Click Score: 19    End of Session Equipment Utilized During Treatment: Gait belt;Rolling walker (2 wheels);Right knee immobilizer  OT Visit Diagnosis: Unsteadiness on feet (R26.81);Muscle weakness (generalized) (M62.81);Repeated falls (R29.6) Pain - Right/Left: Right   Activity Tolerance Patient tolerated treatment well   Patient Left in chair;with call bell/phone within reach   Nurse Communication Mobility status;Patient requests pain meds;Other (comment) (BPs)        Time: 1131-1150 OT Time Calculation (min): 19 min  Charges: OT General Charges $OT Visit: 1 Visit OT Treatments $Therapeutic Activity: 8-22 mins  Arman Filter., MPH, MS, OTR/L ascom [phone] 12/21/22, 12:46 PM

## 2022-12-21 NOTE — Progress Notes (Signed)
Mobility Specialist - Progress Note   12/21/22 1225  Mobility  Activity Ambulated with assistance in hallway;Stood at bedside;Dangled on edge of bed  Level of Assistance Contact guard assist, steadying assist  Assistive Device Front wheel walker  Distance Ambulated (ft) 40 ft  Activity Response Tolerated well  Mobility Referral Yes  $Mobility charge 1 Mobility   Pt supine in bed on RA upon arrival. Pt completes bed mobility indep with extra time. Pt STS and ambulates in hallway CGA with chair follow. Pt takes one seated rest break midway through ambulation. Pt left in recliner with needs in reach.  Laurie Shields  Mobility Specialist  12/21/22 12:27 PM

## 2022-12-21 NOTE — Progress Notes (Signed)
Subjective:   Patient is feeling some better today.  She is out of bed in a chair eating lunch.  Having less pain.  She is participating with PT.  Exam shows moderate tenderness and bruising on the right hip still.  Skins intact.  Neurovascular status good distally.  She is in a knee immobilizer.  Her hemoglobin is stabilized and is up a little bit.    Patient reports pain as mild.  Objective:   VITALS:   Vitals:   12/21/22 0829 12/21/22 1556  BP: 123/65 132/85  Pulse: 95 85  Resp: 17 20  Temp: 98.6 F (37 C) 97.8 F (36.6 C)  SpO2: 97% 98%    Neurologically intact Compartment soft Swelling and bruising of the right hip region  LABS Recent Labs    12/19/22 0417 12/19/22 1412 12/21/22 0457  HGB 7.8* 7.9* 8.4*  HCT 24.0*  --   --   WBC 5.2  --   --   PLT 151  --   --     Recent Labs    12/19/22 0417  NA 136  K 4.2  BUN 20  CREATININE 0.76  GLUCOSE 101*    No results for input(s): "LABPT", "INR" in the last 72 hours.   Assessment/Plan:      Up with therapy Discharge home with home health restart blood thinners when signs of bleeding have stopped.

## 2022-12-22 DIAGNOSIS — I480 Paroxysmal atrial fibrillation: Secondary | ICD-10-CM | POA: Diagnosis not present

## 2022-12-22 DIAGNOSIS — S7011XA Contusion of right thigh, initial encounter: Secondary | ICD-10-CM | POA: Diagnosis not present

## 2022-12-22 DIAGNOSIS — D62 Acute posthemorrhagic anemia: Secondary | ICD-10-CM | POA: Diagnosis not present

## 2022-12-22 LAB — HEMOGLOBIN: Hemoglobin: 8.2 g/dL — ABNORMAL LOW (ref 12.0–15.0)

## 2022-12-22 LAB — HOMOCYSTEINE: Homocysteine: 12.4 umol/L (ref 0.0–21.3)

## 2022-12-22 MED ORDER — ELIQUIS 2.5 MG PO TABS: 2.5000 mg | ORAL_TABLET | Freq: Every day | ORAL | 0 refills | Status: AC

## 2022-12-22 MED ORDER — ORAL CARE MOUTH RINSE: 15.0000 mL | OROMUCOSAL | Status: DC | PRN

## 2022-12-22 MED ORDER — SENNOSIDES-DOCUSATE SODIUM 8.6-50 MG PO TABS: 2.0000 | ORAL_TABLET | Freq: Two times a day (BID) | ORAL | Status: DC

## 2022-12-22 NOTE — TOC Progression Note (Signed)
Transition of Care [facility] - Progression Note    Patient Details  Name: Bernardette Waldron MRN: [id number] Date of Birth: 1933-04-11  Transition of Care [facility]) CM/SW Contact  Marlowe Sax, RN Phone Number: 12/22/2022, 9:55 AM  Clinical Narrative:     Ins approved to go to [employer] today [id number] Made Sue Lush at [facility] and physician aware  Expected Discharge Plan: Skilled Nursing Facility Barriers to Discharge: Barriers Resolved  Expected Discharge Plan and Services   Discharge Planning Services: CM Consult   Living arrangements for the past 2 months: Single Family Home                 DME Arranged: N/A DME Agency: NA       HH Arranged: NA           Social Determinants of Health (SDOH) Interventions SDOH Screenings   Food Insecurity: No Food Insecurity (12/18/2022)  Housing: Low Risk  (12/18/2022)  Transportation Needs: No Transportation Needs (12/18/2022)  Utilities: Not At Risk (12/18/2022)  Tobacco Use: Low Risk  (12/18/2022)    Readmission Risk Interventions     No data to display

## 2022-12-22 NOTE — Progress Notes (Signed)
Attempted to call [facility] to give report. No answer

## 2022-12-22 NOTE — Discharge Summary (Signed)
Physician Discharge Summary   Patient: Laurie Shields MRN: [id number] DOB: Aug 07, 1933  Admit date:     12/17/2022  Discharge date: 12/22/22  Discharge Physician: Marrion Coy   PCP: Danella Penton, MD   Recommendations at discharge:    Follow-up with PCP in 1 week. Follow-up with orthopedics in 1 week. Hold Eliquis for 3 days restart as ordered. Check a CBC before restarting Eliquis. Please follow on homocystine level, if elevated, need to start B12 supplement.  Discharge Diagnoses: Principal Problem:   Traumatic hematoma of right thigh Active Problems:   ABLA (acute blood loss anemia)   Chronic anticoagulation   Accidental fall   Paroxysmal atrial fibrillation   OSA on CPAP   Traumatic hematoma of right hip   Hematoma of right hip, sequela  Resolved Problems:   * No resolved hospital problems. *  Hospital Course:  89 y.o. female with medical history significant for Paroxysmal A-fib on low-dose Eliquis, OSA intolerant of CPAP, chronic exertional dyspnea, diabetes, who presents to the ED with right hip pain following an accidental fall.  Patient was leaning over to smell left lower and lost her footing falling backwards landing on her right hip and hitting the back of the head.  She had no loss of consciousness.  Apart from severe right hip pain she also sustained a skin tear on her right elbow. ED course and data review: Vitals within normal limits except For slightly elevated blood pressure.  Labs unremarkable with hemoglobin 11.9, down from 12.9 on 10/12/2022 EKG showing sinus at 62. Trauma imaging with CT head and C-spine unremarkable CT femur with contrast shows:Two foci of active extravasation along the right hip/lateral thigh musculature, as described above.  4/16.  Patient's hemoglobin down to 7.8.  Hemoglobin was 11.8 on presentation.  Patient hesitant about blood transfusion but is okay if counts continue to drop for transfusion.  Will give IV iron.  Physical therapy  recommending rehab.  Assessment and Plan: Traumatic hematoma of right thigh Acute blood loss anemia. Patient condition appears to be stabilizing, hemoglobin has been stable after giving IV iron. Borderline B12 level at 300, checked homocystine level.  Received injection of B12 x2.  Homocystine level pending, hemoglobin has been stable. Discussed with orthopedics, anticoagulation can be restarted in 3 days.   Atrial fibrillation with Chronic anticoagulation Hold Eliquis due to acute bleeding. Restart Eliquis in 3 days, please check a CBC before started.  Accidental fall Patient will be transferred to nursing home for rehab.   OSA on CPAP Patient intolerant of CPAP   Nausea vomiting. Resolved after having bowel movement.  Continue senna.       Consultants: orthopedics Procedures performed: None  Disposition: Skilled nursing facility Diet recommendation:  Discharge Diet Orders (From admission, onward)     Start     Ordered   12/22/22 0000  Diet - low sodium heart healthy        12/22/22 1014           Cardiac diet DISCHARGE MEDICATION: Allergies as of 12/22/2022       Reactions   Trazodone Other (See Comments), Nausea Only   Blurred vision Other reaction(s): Other (See Comments) Blurred vision        Medication List     TAKE these medications    acetaminophen 500 MG tablet Commonly known as: TYLENOL Take 1,000 mg by mouth every 8 (eight) hours as needed for headache.   Cartia XT 120 MG 24 hr capsule Generic drug: diltiazem  Take 120 mg by mouth 2 (two) times daily.   clobetasol ointment 0.05 % Commonly known as: TEMOVATE Apply 1 Application topically as directed. Apply to the affected area 1-2 times weekly.   co-enzyme Q-10 30 MG capsule Take 30 mg by mouth daily.   Eliquis 2.5 MG Tabs tablet Generic drug: apixaban Take 1 tablet (2.5 mg total) by mouth daily. Start taking on: December 26, 2022 What changed: These instructions start on December 26, 2022. If you are unsure what to do until then, ask your doctor or other care provider.   metoprolol tartrate 25 MG tablet Commonly known as: LOPRESSOR Take 1 tablet by mouth 2 (two) times daily.   multivitamin tablet Take 1 tablet by mouth daily.   senna-docusate 8.6-50 MG tablet Commonly known as: Senokot-S Take 2 tablets by mouth 2 (two) times daily.   Vitamin D3 50 MCG (2000 UT) capsule Take 2,000 Units by mouth daily.               Discharge Care Instructions  (From admission, onward)           Start     Ordered   12/22/22 0000  Discharge wound care:       Comments: Follow with RN   12/22/22 1014            Follow-up Information     Danella Penton, MD Follow up in 1 week(s).   Specialty: Internal Medicine Contact information: [phone] [facility] [address] [facility] [place] [address] [phone]         Deeann Saint, MD Follow up in 1 week(s).   Specialty: Orthopedic Surgery Contact information: [address] [place] [address] [phone]                Discharge Exam: Ceasar Mons Weights   12/17/22 2148  Weight: 56.2 kg   General exam: Appears calm and comfortable  Respiratory system: Clear to auscultation. Respiratory effort normal. Cardiovascular system: S1 & S2 heard, RRR. No JVD, murmurs, rubs, gallops or clicks. No pedal edema. Gastrointestinal system: Abdomen is nondistended, soft and nontender. No organomegaly or masses felt. Normal bowel sounds heard. Central nervous system: Alert and oriented. No focal neurological deficits. Extremities: Symmetric 5 x 5 power. Skin: No rashes, lesions or ulcers Psychiatry: Judgement and insight appear normal. Mood & affect appropriate.    Condition at discharge: good  The results of significant diagnostics from this hospitalization (including imaging, microbiology, ancillary and laboratory) are listed below for reference.   Imaging Studies: CT FEMUR  RIGHT W CONTRAST  Result Date: 12/18/2022 CLINICAL DATA:  Fall, on Eliquis, evaluate for active extravasation EXAM: CT OF THE LOWER RIGHT EXTREMITY WITH CONTRAST TECHNIQUE: Multidetector CT imaging of the lower right extremity was performed according to the standard protocol following intravenous contrast administration. RADIATION DOSE REDUCTION: This exam was performed according to the departmental dose-optimization program which includes automated exposure control, adjustment of the mA and/or kV according to patient size and/or use of iterative reconstruction technique. CONTRAST:  OMNIPAQUE IOHEXOL 300 MG/ML  SOLN COMPARISON:  CT right hip dated 12/17/2022 FINDINGS: Active contrast extravasation along the lateral aspect of the right gluteal musculature (series 5/image 3), with adjacent low-density collection (series 5/image 29) which likely reflects a small intramuscular hematoma. Additional active extravasation lateral to the greater trochanter, at the junction of the gluteus maximus and vastus lateralis muscles (series 5/image 129). This is just medial to the subcutaneous hematoma described on right hip  CT. Study is otherwise unchanged from right hip CT. IMPRESSION: Two foci of active extravasation along the right hip/lateral thigh musculature, as described above. Electronically Signed   By: Charline Bills M.D.   On: 12/18/2022 01:21   CT Hip Right Wo Contrast  Result Date: 12/17/2022 CLINICAL DATA:  Fall, right hip pain, negative radiographs EXAM: CT OF THE RIGHT HIP WITHOUT CONTRAST TECHNIQUE: Multidetector CT imaging of the right hip was performed according to the standard protocol. Multiplanar CT image reconstructions were also generated. RADIATION DOSE REDUCTION: This exam was performed according to the departmental dose-optimization program which includes automated exposure control, adjustment of the mA and/or kV according to patient size and/or use of iterative reconstruction technique.  COMPARISON:  Right hip radiographs dated 12/17/2022 FINDINGS: No fracture or dislocation is seen. Right hip and visualized bony pelvis are intact. Moderate subcutaneous hematoma along the lateral right thigh, overlying the greater trochanter (series 5/image 36). Right hip joint space is preserved. Visualized soft tissue pelvis is grossly unremarkable, noting colonic diverticulosis. IMPRESSION: No acute fracture or dislocation. Moderate subcutaneous hematoma along the lateral right thigh, overlying the greater trochanter. Electronically Signed   By: Charline Bills M.D.   On: 12/17/2022 23:24   CT Head Wo Contrast  Result Date: 12/17/2022 CLINICAL DATA:  Fall, head injury, on Eliquis EXAM: CT HEAD WITHOUT CONTRAST CT CERVICAL SPINE WITHOUT CONTRAST TECHNIQUE: Multidetector CT imaging of the head and cervical spine was performed following the standard protocol without intravenous contrast. Multiplanar CT image reconstructions of the cervical spine were also generated. RADIATION DOSE REDUCTION: This exam was performed according to the departmental dose-optimization program which includes automated exposure control, adjustment of the mA and/or kV according to patient size and/or use of iterative reconstruction technique. COMPARISON:  CT head dated 07/28/2019 FINDINGS: CT HEAD FINDINGS Brain: No evidence of acute infarction, hemorrhage, hydrocephalus, extra-axial collection or mass lesion/mass effect. Subcortical white matter and periventricular small vessel ischemic changes. Vascular: Intracranial atherosclerosis. Skull: Normal. Negative for fracture or focal lesion. Sinuses/Orbits: The visualized paranasal sinuses are essentially clear. The mastoid air cells are unopacified. Other: Small extracranial hematoma overlying the right parietal bone (series 2/image 21). CT CERVICAL SPINE FINDINGS Alignment: Normal cervical lordosis. Skull base and vertebrae: No acute fracture. No primary bone lesion or focal pathologic  process. Soft tissues and spinal canal: No prevertebral fluid or swelling. No visible canal hematoma. Disc levels: Mild degenerative changes of the mid cervical spine. Spinal canal is patent. Upper chest: Visualized lung apices are clear. Other: Visualized thyroid is unremarkable. IMPRESSION: Small extracranial hematoma overlying the right parietal bone. No evidence of calvarial fracture. No acute intracranial abnormality. Small vessel ischemic changes. No traumatic injury to the cervical spine. Mild degenerative changes. Electronically Signed   By: Charline Bills M.D.   On: 12/17/2022 23:03   CT Cervical Spine Wo Contrast  Result Date: 12/17/2022 CLINICAL DATA:  Fall, head injury, on Eliquis EXAM: CT HEAD WITHOUT CONTRAST CT CERVICAL SPINE WITHOUT CONTRAST TECHNIQUE: Multidetector CT imaging of the head and cervical spine was performed following the standard protocol without intravenous contrast. Multiplanar CT image reconstructions of the cervical spine were also generated. RADIATION DOSE REDUCTION: This exam was performed according to the departmental dose-optimization program which includes automated exposure control, adjustment of the mA and/or kV according to patient size and/or use of iterative reconstruction technique. COMPARISON:  CT head dated 07/28/2019 FINDINGS: CT HEAD FINDINGS Brain: No evidence of acute infarction, hemorrhage, hydrocephalus, extra-axial collection or mass lesion/mass effect. Subcortical white matter  and periventricular small vessel ischemic changes. Vascular: Intracranial atherosclerosis. Skull: Normal. Negative for fracture or focal lesion. Sinuses/Orbits: The visualized paranasal sinuses are essentially clear. The mastoid air cells are unopacified. Other: Small extracranial hematoma overlying the right parietal bone (series 2/image 21). CT CERVICAL SPINE FINDINGS Alignment: Normal cervical lordosis. Skull base and vertebrae: No acute fracture. No primary bone lesion or focal  pathologic process. Soft tissues and spinal canal: No prevertebral fluid or swelling. No visible canal hematoma. Disc levels: Mild degenerative changes of the mid cervical spine. Spinal canal is patent. Upper chest: Visualized lung apices are clear. Other: Visualized thyroid is unremarkable. IMPRESSION: Small extracranial hematoma overlying the right parietal bone. No evidence of calvarial fracture. No acute intracranial abnormality. Small vessel ischemic changes. No traumatic injury to the cervical spine. Mild degenerative changes. Electronically Signed   By: Charline Bills M.D.   On: 12/17/2022 23:03   DG Hip Unilat W or Wo Pelvis 2-3 Views Right  Result Date: 12/17/2022 CLINICAL DATA:  Fall on right hip pain EXAM: DG HIP (WITH OR WITHOUT PELVIS) 2-3V RIGHT COMPARISON:  CT abdomen and pelvis 10/02/2022 FINDINGS: There is no evidence of hip fracture or dislocation. Degenerative changes pubic symphysis, both hips, SI joints and lower lumbar spine. Soft tissue swelling about the right hip. IMPRESSION: No acute fracture or dislocation. Electronically Signed   By: Minerva Fester M.D.   On: 12/17/2022 22:41    Microbiology: Results for orders placed or performed during the hospital encounter of 06/29/19  Urine Culture     Status: Abnormal   Collection Time: 06/29/19  1:12 AM   Specimen: Urine, Random  Result Value Ref Range Status   Specimen Description   Final    URINE, RANDOM Performed at [facility], [address] [place], [place] [address]    Special Requests   Final    NONE Performed at [facility] [address], [place], [place] [address]    Culture (A)  Final    <10,000 COLONIES/mL INSIGNIFICANT GROWTH Performed at [facility], [address] [place], [place] [address]    Report Status 06/30/2019 FINAL  Final    Labs: CBC: Recent Labs  Lab 12/17/22 2215 12/18/22 0223 12/18/22 1812 12/19/22 0417 12/19/22 1412 12/21/22 0457 12/22/22 0441   WBC 9.4  --   --  5.2  --   --   --   NEUTROABS 6.7  --   --   --   --   --   --   HGB 11.9*   < > 8.6* 7.8* 7.9* 8.4* 8.2*  HCT 36.9  --   --  24.0*  --   --   --   MCV 93.7  --   --  94.5  --   --   --   PLT 223  --   --  151  --   --   --    < > = values in this interval not displayed.   Basic Metabolic Panel: Recent Labs  Lab 12/17/22 2215 12/19/22 0417  NA 135 136  K 4.2 4.2  CL 102 105  CO2 28 27  GLUCOSE 96 101*  BUN 26* 20  CREATININE 0.98 0.76  CALCIUM 9.4 7.9*   Liver Function Tests: Recent Labs  Lab 12/17/22 2215 12/19/22 0417  AST 25 48*  ALT 18 57*  ALKPHOS 84 66  BILITOT 0.8 0.6  PROT 6.6 4.8*  ALBUMIN 4.1 2.9*   CBG: No  results for input(s): "GLUCAP" in the last 168 hours.  Discharge time spent: greater than 30 minutes.  Signed: Marrion Coy, MD [facility] 12/22/2022

## 2022-12-22 NOTE — Progress Notes (Signed)
Called report to [facility] spoke with [place]. Pt will be going to room 113

## 2022-12-25 ENCOUNTER — Encounter: Payer: Self-pay | Admitting: Student

## 2022-12-25 ENCOUNTER — Non-Acute Institutional Stay (SKILLED_NURSING_FACILITY): Payer: Medicare Other | Admitting: Student

## 2022-12-25 DIAGNOSIS — S7001XD Contusion of right hip, subsequent encounter: Secondary | ICD-10-CM | POA: Diagnosis not present

## 2022-12-25 DIAGNOSIS — Z8673 Personal history of transient ischemic attack (TIA), and cerebral infarction without residual deficits: Secondary | ICD-10-CM

## 2022-12-25 DIAGNOSIS — D62 Acute posthemorrhagic anemia: Secondary | ICD-10-CM

## 2022-12-25 DIAGNOSIS — S7001XS Contusion of right hip, sequela: Secondary | ICD-10-CM | POA: Diagnosis not present

## 2022-12-25 DIAGNOSIS — I10 Essential (primary) hypertension: Secondary | ICD-10-CM

## 2022-12-25 DIAGNOSIS — I480 Paroxysmal atrial fibrillation: Secondary | ICD-10-CM

## 2022-12-25 DIAGNOSIS — Z7901 Long term (current) use of anticoagulants: Secondary | ICD-10-CM

## 2022-12-25 DIAGNOSIS — I63412 Cerebral infarction due to embolism of left middle cerebral artery: Secondary | ICD-10-CM

## 2022-12-25 NOTE — Progress Notes (Addendum)
Provider:  Dr. Earnestine Mealing Location:  Other [place].  Nursing Home Room Number: [facility] 113A Place of Service:  SNF (31)  PCP: Danella Penton, MD Patient Care Team: Danella Penton, MD as PCP - General (Internal Medicine)  Extended Emergency Contact Information Primary Emergency Contact: Madilyn Hook Address: [address]          [place], [place] [address] [place] of [place] Mobile Phone: [phone] Relation: Spouse  Code Status: Full Code.  Goals of Care: Advanced Directive information    12/25/2022    9:44 AM  Advanced Directives  Does Patient Have a Medical Advance Directive? No  Would patient like information on creating a medical advance directive? No - Patient declined    Chief Complaint  Patient presents with   New Admit To SNF    Admission.     HPI: Patient is a 89 y.o. female seen today for admission to [facility].   She has a balance problem after 2 mini-strokes. She stood up too fast and lost her balance. She went backward trying to stop herself. She fell and thought she broke her hip, but she has a large internal bruise. Her husband couldn't find her   She lives [place] on campus and was evaluated by EMT and he bandaged her stretches they got into her car and her husband brought her to emergency room.   She feels like she is making a turn. She is fine until she stands up.   She doesn't wear a CPAP. She takes the leg covering off at night -- she doesn't think she needs the   She lives here in a [place] with her husband. Her husband is competing in senior games -- corn hole. Their team took the gold in doubles. She doesn't use any supportive devices.   Past Medical History:  Diagnosis Date   Atrial fibrillation    Cancer    melanoma   Melanoma    right ankle   Melanoma in situ bx 08/03/20   right upper forearm, EXC 09/08/2020, margins free   SCC (squamous cell carcinoma) 08/11/2021   R pretibia, MOHs completed 09/22/2021   SCC  (squamous cell carcinoma) 08/11/2021   R upper back/base of neck, EDC 09/07/2021   Squamous cell carcinoma of skin 03/11/2019   right mid pretibia (tx with [facility] 7/20)   Past Surgical History:  Procedure Laterality Date   CHOLECYSTECTOMY     MELANOMA EXCISION     VULVECTOMY      reports that she has never smoked. She has never been exposed to tobacco smoke. She has never used smokeless tobacco. She reports that she does not currently use alcohol. She reports that she does not currently use drugs. Social History   Socioeconomic History   Marital status: Married    Spouse name: Not on file   Number of children: Not on file   Years of education: Not on file   Highest education level: Not on file  Occupational History   Not on file  Tobacco Use   Smoking status: Never    Passive exposure: Never   Smokeless tobacco: Never  Substance and Sexual Activity   Alcohol use: Not Currently   Drug use: Not Currently   Sexual activity: Not on file  Other Topics Concern   Not on file  Social History Narrative   Not on file   Social Determinants of Health   Financial Resource Strain: Not on file  Food Insecurity: No  Food Insecurity (12/18/2022)   Hunger Vital Sign    Worried About Running Out of Food in the Last Year: Never true    Ran Out of Food in the Last Year: Never true  Transportation Needs: No Transportation Needs (12/18/2022)   PRAPARE - Administrator, Civil Service (Medical): No    Lack of Transportation (Non-Medical): No  Physical Activity: Not on file  Stress: Not on file  Social Connections: Not on file  Intimate Partner Violence: Not At Risk (12/18/2022)   Humiliation, Afraid, Rape, and Kick questionnaire    Fear of Current or Ex-Partner: No    Emotionally Abused: No    Physically Abused: No    Sexually Abused: No    Functional Status Survey:    Family History  Problem Relation Age of Onset   Cancer Mother    Heart attack Mother    Alzheimer's disease  Father    Breast cancer Neg Hx     Health Maintenance  Topic Date Due   DTaP/Tdap/Td (1 - Tdap) Never done   DEXA SCAN  Never done   Medicare Annual Wellness (AWV)  07/17/2019   COVID-19 Vaccine (8 - 2023-24 season) 05/05/2022   INFLUENZA VACCINE  04/05/2023   Pneumonia Vaccine 12+ Years old  Completed   Zoster Vaccines- Shingrix  Completed   HPV VACCINES  Aged Out    Allergies  Allergen Reactions   Trazodone Other (See Comments) and Nausea Only    Blurred vision Other reaction(s): Other (See Comments) Blurred vision    Outpatient Encounter Medications as of 12/25/2022  Medication Sig   acetaminophen (TYLENOL) 500 MG tablet Take 1,000 mg by mouth every 8 (eight) hours as needed for headache.   CARTIA XT 120 MG 24 hr capsule Take 120 mg by mouth 2 (two) times daily.   Cholecalciferol (VITAMIN D3) 50 MCG (2000 UT) capsule Take 2,000 Units by mouth daily.   clobetasol ointment (TEMOVATE) 0.05 % Apply 1 Application topically as directed. Apply to the affected area 1-2 times weekly.   co-enzyme Q-10 30 MG capsule Take 30 mg by mouth daily.    ELIQUIS 2.5 MG TABS tablet Take 1 tablet (2.5 mg total) by mouth daily.   metoprolol tartrate (LOPRESSOR) 25 MG tablet Take 1 tablet by mouth 2 (two) times daily.   Multiple Vitamin (MULTIVITAMIN) tablet Take 1 tablet by mouth daily.   senna-docusate (SENOKOT-S) 8.6-50 MG tablet Take 2 tablets by mouth 2 (two) times daily.   Zinc Oxide (TRIPLE PASTE) 12.8 % ointment Apply 1 Application topically. Every shift.   No facility-administered encounter medications on file as of 12/25/2022.    Review of Systems  Vitals:   12/25/22 0934  BP: (!) 106/59  Pulse: 73  Resp: 14  Temp: (!) 97.3 F (36.3 C)  SpO2: 94%  Weight: 127 lb 12.8 oz (58 kg)  Height: 5\' 4"  (1.626 m)   Body mass index is 21.94 kg/m. Physical Exam Constitutional:      Appearance: Normal appearance.  Cardiovascular:     Rate and Rhythm: Normal rate and regular rhythm.      Pulses: Normal pulses.     Heart sounds: Normal heart sounds.  Pulmonary:     Effort: Pulmonary effort is normal.  Skin:    Comments: Large ecchymosis of right thigh that extends up to the flank, midline, and down the leg to the knee  Neurological:     Mental Status: She is alert and oriented to person, place,  and time.     Labs reviewed: Basic Metabolic Panel: Recent Labs    10/02/22 1438 12/17/22 2215 12/19/22 0417  NA  --  135 136  K  --  4.2 4.2  CL  --  102 105  CO2  --  28 27  GLUCOSE  --  96 101*  BUN  --  26* 20  CREATININE 1.10* 0.98 0.76  CALCIUM  --  9.4 7.9*   Liver Function Tests: Recent Labs    12/17/22 2215 12/19/22 0417  AST 25 48*  ALT 18 57*  ALKPHOS 84 66  BILITOT 0.8 0.6  PROT 6.6 4.8*  ALBUMIN 4.1 2.9*   No results for input(s): "LIPASE", "AMYLASE" in the last 8760 hours. No results for input(s): "AMMONIA" in the last 8760 hours. CBC: Recent Labs    12/17/22 2215 12/18/22 0223 12/19/22 0417 12/19/22 1412 12/21/22 0457 12/22/22 0441  WBC 9.4  --  5.2  --   --   --   NEUTROABS 6.7  --   --   --   --   --   HGB 11.9*   < > 7.8* 7.9* 8.4* 8.2*  HCT 36.9  --  24.0*  --   --   --   MCV 93.7  --  94.5  --   --   --   PLT 223  --  151  --   --   --    < > = values in this interval not displayed.   Cardiac Enzymes: No results for input(s): "CKTOTAL", "CKMB", "CKMBINDEX", "TROPONINI" in the last 8760 hours. BNP: Invalid input(s): "POCBNP" No results found for: "HGBA1C" No results found for: "TSH" Lab Results  Component Value Date   VITAMINB12 300 12/19/2022   No results found for: "FOLATE" No results found for: "IRON", "TIBC", "FERRITIN"  Imaging and Procedures obtained prior to SNF admission: CT FEMUR RIGHT W CONTRAST  Result Date: 12/18/2022 CLINICAL DATA:  Fall, on Eliquis, evaluate for active extravasation EXAM: CT OF THE LOWER RIGHT EXTREMITY WITH CONTRAST TECHNIQUE: Multidetector CT imaging of the lower right extremity was  performed according to the standard protocol following intravenous contrast administration. RADIATION DOSE REDUCTION: This exam was performed according to the departmental dose-optimization program which includes automated exposure control, adjustment of the mA and/or kV according to patient size and/or use of iterative reconstruction technique. CONTRAST:  OMNIPAQUE IOHEXOL 300 MG/ML  SOLN COMPARISON:  CT right hip dated 12/17/2022 FINDINGS: Active contrast extravasation along the lateral aspect of the right gluteal musculature (series 5/image 3), with adjacent low-density collection (series 5/image 29) which likely reflects a small intramuscular hematoma. Additional active extravasation lateral to the greater trochanter, at the junction of the gluteus maximus and vastus lateralis muscles (series 5/image 129). This is just medial to the subcutaneous hematoma described on right hip CT. Study is otherwise unchanged from right hip CT. IMPRESSION: Two foci of active extravasation along the right hip/lateral thigh musculature, as described above. Electronically Signed   By: Charline Bills M.D.   On: 12/18/2022 01:21   CT Hip Right Wo Contrast  Result Date: 12/17/2022 CLINICAL DATA:  Fall, right hip pain, negative radiographs EXAM: CT OF THE RIGHT HIP WITHOUT CONTRAST TECHNIQUE: Multidetector CT imaging of the right hip was performed according to the standard protocol. Multiplanar CT image reconstructions were also generated. RADIATION DOSE REDUCTION: This exam was performed according to the departmental dose-optimization program which includes automated exposure control, adjustment of the mA and/or kV according  to patient size and/or use of iterative reconstruction technique. COMPARISON:  Right hip radiographs dated 12/17/2022 FINDINGS: No fracture or dislocation is seen. Right hip and visualized bony pelvis are intact. Moderate subcutaneous hematoma along the lateral right thigh, overlying the greater  trochanter (series 5/image 36). Right hip joint space is preserved. Visualized soft tissue pelvis is grossly unremarkable, noting colonic diverticulosis. IMPRESSION: No acute fracture or dislocation. Moderate subcutaneous hematoma along the lateral right thigh, overlying the greater trochanter. Electronically Signed   By: Charline Bills M.D.   On: 12/17/2022 23:24   CT Head Wo Contrast  Result Date: 12/17/2022 CLINICAL DATA:  Fall, head injury, on Eliquis EXAM: CT HEAD WITHOUT CONTRAST CT CERVICAL SPINE WITHOUT CONTRAST TECHNIQUE: Multidetector CT imaging of the head and cervical spine was performed following the standard protocol without intravenous contrast. Multiplanar CT image reconstructions of the cervical spine were also generated. RADIATION DOSE REDUCTION: This exam was performed according to the departmental dose-optimization program which includes automated exposure control, adjustment of the mA and/or kV according to patient size and/or use of iterative reconstruction technique. COMPARISON:  CT head dated 07/28/2019 FINDINGS: CT HEAD FINDINGS Brain: No evidence of acute infarction, hemorrhage, hydrocephalus, extra-axial collection or mass lesion/mass effect. Subcortical white matter and periventricular small vessel ischemic changes. Vascular: Intracranial atherosclerosis. Skull: Normal. Negative for fracture or focal lesion. Sinuses/Orbits: The visualized paranasal sinuses are essentially clear. The mastoid air cells are unopacified. Other: Small extracranial hematoma overlying the right parietal bone (series 2/image 21). CT CERVICAL SPINE FINDINGS Alignment: Normal cervical lordosis. Skull base and vertebrae: No acute fracture. No primary bone lesion or focal pathologic process. Soft tissues and spinal canal: No prevertebral fluid or swelling. No visible canal hematoma. Disc levels: Mild degenerative changes of the mid cervical spine. Spinal canal is patent. Upper chest: Visualized lung apices are  clear. Other: Visualized thyroid is unremarkable. IMPRESSION: Small extracranial hematoma overlying the right parietal bone. No evidence of calvarial fracture. No acute intracranial abnormality. Small vessel ischemic changes. No traumatic injury to the cervical spine. Mild degenerative changes. Electronically Signed   By: Charline Bills M.D.   On: 12/17/2022 23:03   CT Cervical Spine Wo Contrast  Result Date: 12/17/2022 CLINICAL DATA:  Fall, head injury, on Eliquis EXAM: CT HEAD WITHOUT CONTRAST CT CERVICAL SPINE WITHOUT CONTRAST TECHNIQUE: Multidetector CT imaging of the head and cervical spine was performed following the standard protocol without intravenous contrast. Multiplanar CT image reconstructions of the cervical spine were also generated. RADIATION DOSE REDUCTION: This exam was performed according to the departmental dose-optimization program which includes automated exposure control, adjustment of the mA and/or kV according to patient size and/or use of iterative reconstruction technique. COMPARISON:  CT head dated 07/28/2019 FINDINGS: CT HEAD FINDINGS Brain: No evidence of acute infarction, hemorrhage, hydrocephalus, extra-axial collection or mass lesion/mass effect. Subcortical white matter and periventricular small vessel ischemic changes. Vascular: Intracranial atherosclerosis. Skull: Normal. Negative for fracture or focal lesion. Sinuses/Orbits: The visualized paranasal sinuses are essentially clear. The mastoid air cells are unopacified. Other: Small extracranial hematoma overlying the right parietal bone (series 2/image 21). CT CERVICAL SPINE FINDINGS Alignment: Normal cervical lordosis. Skull base and vertebrae: No acute fracture. No primary bone lesion or focal pathologic process. Soft tissues and spinal canal: No prevertebral fluid or swelling. No visible canal hematoma. Disc levels: Mild degenerative changes of the mid cervical spine. Spinal canal is patent. Upper chest: Visualized lung  apices are clear. Other: Visualized thyroid is unremarkable. IMPRESSION: Small extracranial hematoma overlying the  right parietal bone. No evidence of calvarial fracture. No acute intracranial abnormality. Small vessel ischemic changes. No traumatic injury to the cervical spine. Mild degenerative changes. Electronically Signed   By: Charline Bills M.D.   On: 12/17/2022 23:03   DG Hip Unilat W or Wo Pelvis 2-3 Views Right  Result Date: 12/17/2022 CLINICAL DATA:  Fall on right hip pain EXAM: DG HIP (WITH OR WITHOUT PELVIS) 2-3V RIGHT COMPARISON:  CT abdomen and pelvis 10/02/2022 FINDINGS: There is no evidence of hip fracture or dislocation. Degenerative changes pubic symphysis, both hips, SI joints and lower lumbar spine. Soft tissue swelling about the right hip. IMPRESSION: No acute fracture or dislocation. Electronically Signed   By: Minerva Fester M.D.   On: 12/17/2022 22:41    Assessment/Plan Hematoma of right hip, sequela  Paroxysmal atrial fibrillation  Chronic anticoagulation  ABLA (acute blood loss anemia)  Cerebrovascular accident (CVA) due to embolism of left middle cerebral artery  Benign essential HTN  History of TIA (transient ischemic attack) Patient with large hematoma after ground level fall. Admitted for physical therapy. CBC pending, will wait for final result before restarting anticoagulation. Continue diltiazem 120 mg daily and metoprolol for rate control. Last Hgb 8. On 4/19. Recheck today. If stable, will restart eliquis tomorrow. Patient with continued balance issues due to hx of CVA and recurrent TIAs. Will continue to monitor for symptoms.    Family/ staff Communication: Patient, nursing  Labs/tests ordered: CBC

## 2023-03-15 ENCOUNTER — Ambulatory Visit
Admission: RE | Admit: 2023-03-15 | Discharge: 2023-03-15 | Disposition: A | Discharge status: Home / Self Care | Payer: Medicare Other | Source: Ambulatory Visit | Attending: Urology | Admitting: Urology

## 2023-03-15 ENCOUNTER — Ambulatory Visit: Payer: Medicare Other | Admitting: Urology

## 2023-03-15 DIAGNOSIS — N2889 Other specified disorders of kidney and ureter: Secondary | ICD-10-CM | POA: Insufficient documentation

## 2023-03-15 MED ORDER — IOHEXOL 300 MG/ML  SOLN
100.0000 mL | Freq: Once | INTRAMUSCULAR | Status: AC | PRN
  Administered 2023-03-15: 100 mL via INTRAVENOUS

## 2023-03-20 ENCOUNTER — Encounter: Payer: Self-pay | Admitting: Urology

## 2023-03-20 ENCOUNTER — Ambulatory Visit: Payer: Medicare Other | Admitting: Urology

## 2023-03-20 VITALS — BP 91/51 | HR 67 | Ht 64.0 in | Wt 125.2 lb

## 2023-03-20 DIAGNOSIS — N2889 Other specified disorders of kidney and ureter: Secondary | ICD-10-CM | POA: Diagnosis not present

## 2023-03-20 NOTE — Progress Notes (Signed)
   03/20/2023 1:34 PM   Maalle Starrett 1933/07/12 [id number]  Reason for visit: Follow up left renal lesion  HPI: 89 year old female with history of melanoma and mesenteric ischemia who was found to have a 7 mm left exophytic indeterminate renal lesion on CT from September 2023, this was new from a prior CT stone protocol in 2020.  Renal function borderline with creatinine 1.2, EGFR 43.  At our original visit in January 2024 she opted for active surveillance.  I personally viewed and interpreted the repeat CT scan dated 03/15/2023 showing a stable 7 mm left renal lesion that could represent an early small papillary RCC versus hemorrhagic cyst.  She also had a significant fall in April 2024 and was hospitalized, most recent CT also shows a mild to moderate superior endplate compression fracture at T11.  Will forward these results to PCP.  Reassurance provided regarding stable CT findings and very small left renal lesion, this would be extremely unlikely to become symptomatic or significantly enlarge.  Recommend repeat CT in 1 year and if stable likely do not need to continue yearly surveillance imaging  RTC 1 year CT reviewed prior, can follow-up as needed if stable at that time Will forward results of recent CT to PCP regarding T11 fracture  Sondra Come, MD  [facility] [address], [address] [place], [place] [address] [phone]

## 2023-06-27 ENCOUNTER — Ambulatory Visit: Payer: Medicare Other | Admitting: Dermatology

## 2023-06-27 DIAGNOSIS — D692 Other nonthrombocytopenic purpura: Secondary | ICD-10-CM

## 2023-06-27 DIAGNOSIS — W908XXA Exposure to other nonionizing radiation, initial encounter: Secondary | ICD-10-CM | POA: Diagnosis not present

## 2023-06-27 DIAGNOSIS — L814 Other melanin hyperpigmentation: Secondary | ICD-10-CM | POA: Diagnosis not present

## 2023-06-27 DIAGNOSIS — L570 Actinic keratosis: Secondary | ICD-10-CM

## 2023-06-27 DIAGNOSIS — Z86006 Personal history of melanoma in-situ: Secondary | ICD-10-CM

## 2023-06-27 DIAGNOSIS — Z85820 Personal history of malignant melanoma of skin: Secondary | ICD-10-CM

## 2023-06-27 DIAGNOSIS — Z85828 Personal history of other malignant neoplasm of skin: Secondary | ICD-10-CM

## 2023-06-27 DIAGNOSIS — L853 Xerosis cutis: Secondary | ICD-10-CM

## 2023-06-27 DIAGNOSIS — Z1283 Encounter for screening for malignant neoplasm of skin: Secondary | ICD-10-CM

## 2023-06-27 DIAGNOSIS — D225 Melanocytic nevi of trunk: Secondary | ICD-10-CM

## 2023-06-27 DIAGNOSIS — L821 Other seborrheic keratosis: Secondary | ICD-10-CM

## 2023-06-27 DIAGNOSIS — L578 Other skin changes due to chronic exposure to nonionizing radiation: Secondary | ICD-10-CM | POA: Diagnosis not present

## 2023-06-27 DIAGNOSIS — D229 Melanocytic nevi, unspecified: Secondary | ICD-10-CM

## 2023-06-27 NOTE — Patient Instructions (Addendum)
Cryotherapy Aftercare  Wash gently with soap and water everyday.   Apply Vaseline and Band-Aid daily until healed.    Gentle Skin Care Guide  1. Bathe no more than once a day.  2. Avoid bathing in hot water  3. Use a mild soap like Dove, Vanicream, Cetaphil, CeraVe. Can use Lever 2000 or Cetaphil antibacterial soap  4. Use soap only where you need it. On most days, use it under your arms, between your legs, and on your feet. Let the water rinse other areas unless visibly dirty.  5. When you get out of the bath/shower, use a towel to gently blot your skin dry, don't rub it.  6. While your skin is still a little damp, apply a moisturizing cream such as Vanicream, CeraVe, Cetaphil, Eucerin, Sarna lotion or plain Vaseline Jelly. For hands apply Neutrogena [place] Hand Cream or Excipial Hand Cream.  7. Reapply moisturizer any time you start to itch or feel dry.  8. Sometimes using free and clear laundry detergents can be helpful. Fabric softener sheets should be avoided. Downy Free & Gentle liquid, or any liquid fabric softener that is free of dyes and perfumes, it acceptable to use  9. If your doctor has given you prescription creams you may apply moisturizers over them     Melanoma ABCDEs  Melanoma is the most dangerous type of skin cancer, and is the leading cause of death from skin disease.  You are more likely to develop melanoma if you: Have light-colored skin, light-colored eyes, or red or blond hair Spend a lot of time in the sun Tan regularly, either outdoors or in a tanning bed Have had blistering sunburns, especially during childhood Have a close family member who has had a melanoma Have atypical moles or large birthmarks  Early detection of melanoma is key since treatment is typically straightforward and cure rates are extremely high if we catch it early.   The first sign of melanoma is often a change in a mole or a new dark spot.  The ABCDE system is a way of  remembering the signs of melanoma.  A for asymmetry:  The two halves do not match. B for border:  The edges of the growth are irregular. C for color:  A mixture of colors are present instead of an even brown color. D for diameter:  Melanomas are usually (but not always) greater than 6mm - the size of a pencil eraser. E for evolution:  The spot keeps changing in size, shape, and color.  Please check your skin once per month between visits. You can use a small mirror in front and a large mirror behind you to keep an eye on the back side or your body.   If you see any new or changing lesions before your next follow-up, please call to schedule a visit.  Please continue daily skin protection including broad spectrum sunscreen SPF 30+ to sun-exposed areas, reapplying every 2 hours as needed when you're outdoors.   Staying in the shade or wearing long sleeves, sun glasses (UVA+UVB protection) and wide brim hats (4-inch brim around the entire circumference of the hat) are also recommended for sun protection.     Due to recent changes in healthcare laws, you may see results of your pathology and/or laboratory studies on MyChart before the doctors have had a chance to review them. We understand that in some cases there may be results that are confusing or concerning to you. Please understand that not all  results are received at the same time and often the doctors may need to interpret multiple results in order to provide you with the best plan of care or course of treatment. Therefore, we ask that you please give [place] 2 business days to thoroughly review all your results before contacting the office for clarification. Should we see a critical lab result, you will be contacted sooner.   If You Need Anything After Your Visit  If you have any questions or concerns for your doctor, please call our main line at [phone] and press option 4 to reach your doctor's medical assistant. If no one answers, please  leave a voicemail as directed and we will return your call as soon as possible. Messages left after 4 pm will be answered the following business day.   You may also send [place] a message via MyChart. We typically respond to MyChart messages within 1-2 business days.  For prescription refills, please ask your pharmacy to contact our office. Our fax number is [phone].  If you have an urgent issue when the clinic is closed that cannot wait until the next business day, you can page your doctor at the number below.    Please note that while we do our best to be available for urgent issues outside of office hours, we are not available 24/7.   If you have an urgent issue and are unable to reach [place], you may choose to seek medical care at your doctor's office, retail clinic, urgent care center, or emergency room.  If you have a medical emergency, please immediately call 911 or go to the emergency department.  Pager Numbers  - Dr. Gwen Pounds: [phone]  - Dr. Roseanne Reno: [phone]  - Dr. Katrinka Blazing: [phone]   In the event of inclement weather, please call our main line at [phone] for an update on the status of any delays or closures.  Dermatology Medication Tips: Please keep the boxes that topical medications come in in order to help keep track of the instructions about where and how to use these. Pharmacies typically print the medication instructions only on the boxes and not directly on the medication tubes.   If your medication is too expensive, please contact our office at [phone] option 4 or send [place] a message through MyChart.   We are unable to tell what your co-pay for medications will be in advance as this is different depending on your insurance coverage. However, we may be able to find a substitute medication at lower cost or fill out paperwork to get insurance to cover a needed medication.   If a prior authorization is required to get your medication covered by your  insurance company, please allow [place] 1-2 business days to complete this process.  Drug prices often vary depending on where the prescription is filled and some pharmacies may offer cheaper prices.  The website www.goodrx.com contains coupons for medications through different pharmacies. The prices here do not account for what the cost may be with help from insurance (it may be cheaper with your insurance), but the website can give you the price if you did not use any insurance.  - You can print the associated coupon and take it with your prescription to the pharmacy.  - You may also stop by our office during regular business hours and pick up a GoodRx coupon card.  - If you need your prescription sent electronically to a different pharmacy, notify our office through [facility] or by phone at  [phone] option 4.     Si Usted Necesita Algo Despus de Su Visita  Tambin puede enviarnos un mensaje a travs de Clinical cytogeneticist. Por lo general respondemos a los mensajes de MyChart en el transcurso de 1 a 2 das hbiles.  Para renovar recetas, por favor pida a su farmacia que se ponga en contacto con nuestra oficina. Annie Sable de fax es [place] [phone].  Si tiene un asunto urgente cuando la clnica est cerrada y que no puede esperar hasta el siguiente da hbil, puede llamar/localizar a su doctor(a) al nmero que aparece a continuacin.   Por favor, tenga en cuenta que aunque hacemos todo lo posible para estar disponibles para asuntos urgentes fuera del horario de [place], no estamos disponibles las 24 horas del da, los 7 [address]  [address] de la [place].   Si tiene un problema urgente y no puede comunicarse con nosotros, puede optar por buscar atencin mdica  en el consultorio de su doctor(a), en una clnica privada, en un centro de atencin urgente o en una sala de emergencias.  Si tiene Engineer, drilling, por favor llame inmediatamente al 911 o vaya a la sala de emergencias.  Nmeros de  bper  - Dr. Gwen Pounds: [phone]  - Dra. Roseanne Reno: [phone]  - Dr. Katrinka Blazing: [phone]   En caso de inclemencias del tiempo, por favor llame a Lacy Duverney principal al [phone] para una actualizacin sobre el [facility] de cualquier retraso o cierre.  Consejos para la medicacin en dermatologa: Por favor, guarde las cajas en las que vienen los medicamentos de uso tpico para ayudarle a seguir las instrucciones sobre dnde y cmo usarlos. Las farmacias generalmente imprimen las instrucciones del medicamento slo en las cajas y no directamente en los tubos del [place].   Si su medicamento es muy caro, por favor, pngase en contacto con Rolm Gala llamando al [phone] y presione la opcin 4 o envenos un mensaje a travs de Clinical cytogeneticist.   No podemos decirle cul ser su copago por los medicamentos por adelantado ya que esto es diferente dependiendo de la cobertura de su seguro. Sin embargo, es posible que podamos encontrar un medicamento sustituto a Audiological scientist un formulario para que el seguro cubra el medicamento que se considera necesario.   Si se requiere una autorizacin previa para que su compaa de seguros [place] su medicamento, por favor permtanos de 1 a 2 das hbiles para completar [address]  Los precios de los medicamentos varan con frecuencia dependiendo del Environmental consultant de dnde se surte la receta y alguna farmacias pueden ofrecer precios ms baratos.  El sitio web www.goodrx.com tiene cupones para medicamentos de Health and safety inspector. Los precios aqu no tienen en cuenta lo que podra costar con la ayuda del seguro (puede ser ms barato con su seguro), pero el sitio web puede darle el precio si no utiliz Tourist information centre manager.  - Puede imprimir el cupn correspondiente y llevarlo con su receta a la farmacia.  - Tambin puede pasar por nuestra oficina durante el horario de atencin regular y Education officer, museum una tarjeta de cupones de GoodRx.  - Si necesita que su receta se  enve electrnicamente a una farmacia diferente, informe a nuestra oficina a travs de MyChart de [place] o por telfono llamando al [phone] y presione la opcin 4.

## 2023-06-27 NOTE — Progress Notes (Signed)
Follow-Up Visit   Subjective  Laurie Shields is a 89 y.o. female who presents for the following: Skin Cancer Screening and Full Body Skin Exam  The patient presents for Total-Body Skin Exam (TBSE) for skin cancer screening and mole check. The patient has spots, moles and lesions to be evaluated, some may be new or changing and the patient may have concern these could be cancer. 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 History of SCCs.  She has a spot on her R leg that she would like checked- it was rough, raised and sore.  It has improved some since she made the appointment  The following portions of the chart were reviewed this encounter and updated as appropriate: medications, allergies, medical history  Review of Systems:  No other skin or systemic complaints except as noted in HPI or Assessment and Plan.  Objective  Well appearing patient in no apparent distress; mood and affect are within normal limits.  A full examination was performed including scalp, head, eyes, ears, nose, lips, neck, chest, axillae, abdomen, back, buttocks, bilateral upper extremities, bilateral lower extremities, hands, feet, fingers, toes, fingernails, and toenails. All findings within normal limits unless otherwise noted below.   Relevant physical exam findings are noted in the Assessment and Plan.  left clavicle 1.4 x 1.2cm tan patch  right lateral upper ankle 1.0cm two tone waxy tan macule  R lat lower leg x 2 (2) 6.0 mm firm scaly thin papule, keratotic macule    Assessment & Plan   SKIN CANCER SCREENING PERFORMED TODAY.  ACTINIC DAMAGE - Chronic condition, secondary to cumulative UV/sun exposure - diffuse scaly erythematous macules with underlying dyspigmentation - Recommend daily broad spectrum sunscreen SPF 30+ to sun-exposed areas, reapply every 2 hours as needed.  - Staying in the shade or wearing long sleeves, sun glasses  (UVA+UVB protection) and wide brim hats (4-inch brim around the entire circumference of the hat) are also recommended for sun protection.  - Call for new or changing lesions.  LENTIGINES, SEBORRHEIC KERATOSES, HEMANGIOMAS - Benign normal skin lesions - Benign-appearing - Call for any changes  MELANOCYTIC NEVI - Tan-brown and/or pink-flesh-colored symmetric macules and papules - Left abdomen 4mm tan macule with darker edge, stable compared to photo 05/31/22 - Benign appearing on exam today - Observation - Call clinic for new or changing moles - Recommend daily use of broad spectrum spf 30+ sunscreen to sun-exposed areas.   HISTORY OF MELANOMA IN SITU Right upper forearm, Excised 09/08/2020 - No evidence of recurrence today - Recommend regular full body skin exams - Recommend daily broad spectrum sunscreen SPF 30+ to sun-exposed areas, reapply every 2 hours as needed.  - Call if any new or changing lesions are noted between office visits  HISTORY OF MELANOMA Right ankle - No evidence of recurrence today - Recommend regular full body skin exams - Recommend daily broad spectrum sunscreen SPF 30+ to sun-exposed areas, reapply every 2 hours as needed.  - Call if any new or changing lesions are noted between office visits  HISTORY OF SQUAMOUS CELL CARCINOMA OF THE SKIN Right pretibia, 2023 Right upper back/base of neck, 2023 Right mid pretibia, 2020 - No evidence of recurrence today - No lymphadenopathy - Recommend regular full body skin exams - Recommend daily broad spectrum sunscreen SPF 30+ to sun-exposed areas, reapply every 2 hours as needed.  - Call if any new or changing lesions are noted  between office visits  Purpura - Chronic; persistent and recurrent.  Treatable, but not curable. - Violaceous macules and patches - Benign - Related to trauma, age, sun damage and/or use of blood thinners, chronic use of topical and/or oral steroids - Observe - Can use OTC arnica containing  moisturizer such as Dermend Bruise Formula if desired - Call for worsening or other concerns  Xerosis - diffuse xerotic patches - recommend gentle, hydrating skin care - gentle skin care handout given  Lentigo left clavicle  Lentigo vs Flat SK  Benign-appearing. Stable compared to photo 05/31/22. Observation.  Call clinic for new or changing moles.  Recommend daily use of broad spectrum spf 30+ sunscreen to sun-exposed areas.    Seborrheic keratosis right lateral upper ankle  Benign-appearing. Stable compared to photo 05/31/22. Observation.  Call clinic for new or changing moles.  Recommend daily use of broad spectrum spf 30+ sunscreen to sun-exposed areas.    Hypertrophic actinic keratosis (2) R lat lower leg x 2  vs Inflamed SK.  Recheck on f/up  Actinic keratoses are precancerous spots that appear secondary to cumulative UV radiation exposure/sun exposure over time. They are chronic with expected duration over 1 year. A portion of actinic keratoses will progress to squamous cell carcinoma of the skin. It is not possible to reliably predict which spots will progress to skin cancer and so treatment is recommended to prevent development of skin cancer.  Recommend daily broad spectrum sunscreen SPF 30+ to sun-exposed areas, reapply every 2 hours as needed.  Recommend staying in the shade or wearing long sleeves, sun glasses (UVA+UVB protection) and wide brim hats (4-inch brim around the entire circumference of the hat). Call for new or changing lesions.  Destruction of lesion - R lat lower leg x 2 (2)  Destruction method: cryotherapy   Informed consent: discussed and consent obtained   Lesion destroyed using liquid nitrogen: Yes   Region frozen until ice ball extended beyond lesion: Yes   Outcome: patient tolerated procedure well with no complications   Post-procedure details: wound care instructions given   Additional details:  Prior to procedure, discussed risks of blister  formation, small wound, skin dyspigmentation, or rare scar following cryotherapy. Recommend Vaseline ointment to treated areas while healing.   Skin cancer screening  Actinic skin damage  History of melanoma in situ  History of melanoma  History of SCC (squamous cell carcinoma) of skin  Senile purpura (HCC)  Nevus  Xerosis cutis   Return in about 6 months (around 12/26/2023) for TBSE, Hx melanoma, Hx SCC.  ICherlyn Labella, CMA, am acting as scribe for Willeen Niece, MD .   Documentation: I have reviewed the above documentation for accuracy and completeness, and I agree with the above.  Willeen Niece, MD

## 2023-09-20 ENCOUNTER — Other Ambulatory Visit: Payer: Self-pay | Admitting: Internal Medicine

## 2023-09-20 DIAGNOSIS — Z1231 Encounter for screening mammogram for malignant neoplasm of breast: Secondary | ICD-10-CM

## 2023-10-05 ENCOUNTER — Ambulatory Visit
Admission: RE | Admit: 2023-10-05 | Discharge: 2023-10-05 | Disposition: A | Discharge status: Home / Self Care | Payer: Medicare Other | Source: Ambulatory Visit | Attending: Internal Medicine | Admitting: Internal Medicine

## 2023-10-05 DIAGNOSIS — Z1231 Encounter for screening mammogram for malignant neoplasm of breast: Secondary | ICD-10-CM | POA: Diagnosis present

## 2023-10-27 ENCOUNTER — Emergency Department: Payer: Medicare Other

## 2023-10-27 ENCOUNTER — Emergency Department
Admission: EM | Admit: 2023-10-27 | Discharge: 2023-10-27 | Disposition: A | Discharge status: Home / Self Care | Payer: Medicare Other | Attending: Emergency Medicine | Admitting: Emergency Medicine

## 2023-10-27 ENCOUNTER — Other Ambulatory Visit: Payer: Self-pay

## 2023-10-27 DIAGNOSIS — N390 Urinary tract infection, site not specified: Secondary | ICD-10-CM | POA: Diagnosis not present

## 2023-10-27 DIAGNOSIS — Z85820 Personal history of malignant melanoma of skin: Secondary | ICD-10-CM | POA: Insufficient documentation

## 2023-10-27 DIAGNOSIS — I4891 Unspecified atrial fibrillation: Secondary | ICD-10-CM | POA: Diagnosis not present

## 2023-10-27 DIAGNOSIS — Z7901 Long term (current) use of anticoagulants: Secondary | ICD-10-CM | POA: Insufficient documentation

## 2023-10-27 DIAGNOSIS — I2510 Atherosclerotic heart disease of native coronary artery without angina pectoris: Secondary | ICD-10-CM | POA: Insufficient documentation

## 2023-10-27 DIAGNOSIS — Z85828 Personal history of other malignant neoplasm of skin: Secondary | ICD-10-CM | POA: Diagnosis not present

## 2023-10-27 DIAGNOSIS — Z79899 Other long term (current) drug therapy: Secondary | ICD-10-CM | POA: Diagnosis not present

## 2023-10-27 DIAGNOSIS — R0602 Shortness of breath: Secondary | ICD-10-CM

## 2023-10-27 LAB — URINALYSIS, W/ REFLEX TO CULTURE (INFECTION SUSPECTED)
Bacteria, UA: NONE SEEN
Bilirubin Urine: NEGATIVE
Glucose, UA: 50 mg/dL — AB
Hgb urine dipstick: NEGATIVE
Ketones, ur: NEGATIVE mg/dL
Nitrite: NEGATIVE
Protein, ur: NEGATIVE mg/dL
Specific Gravity, Urine: 1.006 (ref 1.005–1.030)
pH: 7 (ref 5.0–8.0)

## 2023-10-27 LAB — CBC WITH DIFFERENTIAL/PLATELET
Abs Immature Granulocytes: 0.03 10*3/uL (ref 0.00–0.07)
Basophils Absolute: 0.1 10*3/uL (ref 0.0–0.1)
Basophils Relative: 1 %
Eosinophils Absolute: 0.2 10*3/uL (ref 0.0–0.5)
Eosinophils Relative: 3 %
HCT: 39.4 % (ref 36.0–46.0)
Hemoglobin: 12.8 g/dL (ref 12.0–15.0)
Immature Granulocytes: 0 %
Lymphocytes Relative: 25 %
Lymphs Abs: 1.7 10*3/uL (ref 0.7–4.0)
MCH: 31 pg (ref 26.0–34.0)
MCHC: 32.5 g/dL (ref 30.0–36.0)
MCV: 95.4 fL (ref 80.0–100.0)
Monocytes Absolute: 0.7 10*3/uL (ref 0.1–1.0)
Monocytes Relative: 10 %
Neutro Abs: 4.2 10*3/uL (ref 1.7–7.7)
Neutrophils Relative %: 61 %
Platelets: 208 10*3/uL (ref 150–400)
RBC: 4.13 MIL/uL (ref 3.87–5.11)
RDW: 12.7 % (ref 11.5–15.5)
WBC: 6.9 10*3/uL (ref 4.0–10.5)
nRBC: 0 % (ref 0.0–0.2)

## 2023-10-27 LAB — TROPONIN I (HIGH SENSITIVITY)
Troponin I (High Sensitivity): 9 ng/L (ref <18)
Troponin I (High Sensitivity): 7 ng/L (ref <18)

## 2023-10-27 LAB — COMPREHENSIVE METABOLIC PANEL
ALT: 16 U/L (ref 0–44)
AST: 16 U/L (ref 15–41)
Albumin: 3.7 g/dL (ref 3.5–5.0)
Alkaline Phosphatase: 72 U/L (ref 38–126)
Anion gap: 8 (ref 5–15)
BUN: 24 mg/dL — ABNORMAL HIGH (ref 8–23)
CO2: 26 mmol/L (ref 22–32)
Calcium: 9.3 mg/dL (ref 8.9–10.3)
Chloride: 106 mmol/L (ref 98–111)
Creatinine, Ser: 1.04 mg/dL — ABNORMAL HIGH (ref 0.44–1.00)
GFR, Estimated: 52 mL/min — ABNORMAL LOW (ref >60)
Glucose, Bld: 120 mg/dL — ABNORMAL HIGH (ref 70–99)
Potassium: 4.3 mmol/L (ref 3.5–5.1)
Sodium: 140 mmol/L (ref 135–145)
Total Bilirubin: 0.9 mg/dL (ref 0.0–1.2)
Total Protein: 6.5 g/dL (ref 6.5–8.1)

## 2023-10-27 LAB — RESP PANEL BY RT-PCR (RSV, FLU A&B, COVID)  RVPGX2
Influenza A by PCR: NEGATIVE
Influenza B by PCR: NEGATIVE
Resp Syncytial Virus by PCR: NEGATIVE
SARS Coronavirus 2 by RT PCR: NEGATIVE

## 2023-10-27 LAB — D-DIMER, QUANTITATIVE: D-Dimer, Quant: 0.4 ug{FEU}/mL (ref 0.00–0.50)

## 2023-10-27 LAB — BRAIN NATRIURETIC PEPTIDE: B Natriuretic Peptide: 404.8 pg/mL — ABNORMAL HIGH (ref 0.0–100.0)

## 2023-10-27 LAB — MAGNESIUM: Magnesium: 2.2 mg/dL (ref 1.7–2.4)

## 2023-10-27 MED ORDER — LACTATED RINGERS IV BOLUS
500.0000 mL | Freq: Once | INTRAVENOUS | Status: AC
  Administered 2023-10-27: 500 mL via INTRAVENOUS

## 2023-10-27 MED ORDER — METOPROLOL TARTRATE 25 MG PO TABS
25.0000 mg | ORAL_TABLET | Freq: Once | ORAL | Status: AC
  Administered 2023-10-27: 25 mg via ORAL
  Filled 2023-10-27: qty 1

## 2023-10-27 MED ORDER — DILTIAZEM HCL ER COATED BEADS 120 MG PO CP24
120.0000 mg | ORAL_CAPSULE | Freq: Once | ORAL | Status: AC
  Administered 2023-10-27: 120 mg via ORAL
  Filled 2023-10-27 (×2): qty 1

## 2023-10-27 MED ORDER — CEPHALEXIN 500 MG PO CAPS
500.0000 mg | ORAL_CAPSULE | Freq: Once | ORAL | Status: AC
  Administered 2023-10-27: 500 mg via ORAL
  Filled 2023-10-27: qty 1

## 2023-10-27 MED ORDER — CEPHALEXIN 500 MG PO CAPS: 500.0000 mg | ORAL_CAPSULE | Freq: Four times a day (QID) | ORAL | 0 refills | Status: AC

## 2023-10-27 NOTE — ED Notes (Signed)
 Called pharmacy to send up Cardizem. Not loaded in pyxis

## 2023-10-27 NOTE — ED Provider Notes (Signed)
 [facility] Provider Note    Event Date/Time   First MD Initiated Contact with Patient 10/27/23 [phone]     (approximate)   History   Shortness of Breath   HPI  Laurie Shields is a 89 y.o. female with history of paroxysmal A-fib on Eliquis and Cardizem, IBS, history of melanoma and squamous cell carcinoma with no evidence of recurrence per Derm, here for shortness of breath.  Patient states she woke up today, felt like it was hard to take a deep breath.  She denies any chest pain or palpitations.  Has a cough that is been ongoing for 6 months.  No leg swelling.  Denies any nausea, vomiting, diarrhea.  No fevers.  Denies any dysuria.  Has not taken her metoprolol or diltiazem or her Eliquis today.  Patient also noted a headache in the morning that has improved.  States that she does get headaches daily.  And is not unusual for her  On independent chart review, Derm notes were seen from October that showed no recurrence of her cancer, she was seen by her cardiologist in October as well for paroxysmal A-fib and continued on her Dilt.  She does have a history of CAD and diabetes as well from the the allergist note.  Is also supposed to be on metoprolol in addition to the Dilt.  She does have chronic exertional dyspnea per their note.     Physical Exam   Triage Vital Signs: ED Triage Vitals  Encounter Vitals Group     BP 10/27/23 0839 (!) 174/78     Systolic BP Percentile --      Diastolic BP Percentile --      Pulse Rate 10/27/23 0839 (!) 142     Resp --      Temp --      Temp src --      SpO2 10/27/23 0839 98 %     Weight --      Height --      Head Circumference --      Peak Flow --      Pain Score 10/27/23 0841 0     Pain Loc --      Pain Education --      Exclude from Growth Chart --     Most recent vital signs: Vitals:   10/27/23 1035 10/27/23 1130  BP: 107/75 (!) 134/94  Pulse:  82  Resp:  14  Temp:    SpO2: 99% 98%     General: Awake, no  distress.  CV:  Good peripheral perfusion.  Tachycardic, heart rate ranging from the 110s to high 120s when I am in the room Resp:  Normal effort.  Clear Abd:  No distention.  Soft nontender Other:  No lower extremity edema, no unilateral calf swelling or tenderness.  No focal weakness or numbness, no facial droop.   ED Results / Procedures / Treatments   Labs (all labs ordered are listed, but only abnormal results are displayed) Labs Reviewed  COMPREHENSIVE METABOLIC PANEL - Abnormal; Notable for the following components:      Result Value   Glucose, Bld 120 (*)    BUN 24 (*)    Creatinine, Ser 1.04 (*)    GFR, Estimated 52 (*)    All other components within normal limits  BRAIN NATRIURETIC PEPTIDE - Abnormal; Notable for the following components:   B Natriuretic Peptide 404.8 (*)    All other components within normal limits  URINALYSIS, W/ REFLEX TO CULTURE (INFECTION SUSPECTED) - Abnormal; Notable for the following components:   Color, Urine STRAW (*)    APPearance CLEAR (*)    Glucose, UA 50 (*)    Leukocytes,Ua SMALL (*)    All other components within normal limits  RESP PANEL BY RT-PCR (RSV, FLU A&B, COVID)  RVPGX2  URINE CULTURE  CBC WITH DIFFERENTIAL/PLATELET  MAGNESIUM  D-DIMER, QUANTITATIVE  TROPONIN I (HIGH SENSITIVITY)  TROPONIN I (HIGH SENSITIVITY)     EKG  Atrial fibrillation, rate 114, normal QRS, normal QTc, baseline is wandering but no ischemic ST elevation, she is T wave flattening in aVL, is a change compared to prior since prior EKG showed sinus rhythm.   RADIOLOGY Chest x-ray on my interpretation without focal consolidation   PROCEDURES:  Critical Care performed: No  Ultrasound ED Echo  Date/Time: 10/27/2023 10:08 AM  Performed by: Claybon Jabs, MD Authorized by: Claybon Jabs, MD   Procedure details:    Indications: dyspnea     Views: subxiphoid, parasternal long axis view and apical 4 chamber view   Findings:    Pericardium: no  pericardial effusion     RV Diameter: normal   Impression:    Impression: normal   Ultrasound ED Thoracic  Date/Time: 10/27/2023 10:08 AM  Performed by: Claybon Jabs, MD Authorized by: Claybon Jabs, MD   Procedure details:    Indications: dyspnea     Left lung pleural:  Visualized   Right lung pleural:  Visualized Findings:    A-lines noted throughout: identified     B-lines noted throughout: not identified   Impression:    Impression: none      MEDICATIONS ORDERED IN ED: Medications  lactated ringers bolus 500 mL (0 mLs Intravenous Stopped 10/27/23 0923)  diltiazem (CARDIZEM CD) 24 hr capsule 120 mg (120 mg Oral Given 10/27/23 0922)  metoprolol tartrate (LOPRESSOR) tablet 25 mg (25 mg Oral Given 10/27/23 1003)  cephALEXin (KEFLEX) capsule 500 mg (500 mg Oral Given 10/27/23 1133)     IMPRESSION / MDM / ASSESSMENT AND PLAN / ED COURSE  I reviewed the triage vital signs and the nursing notes.                              Differential diagnosis includes, but is not limited to, arrhythmia, atrial fibrillation with RVR, electrolyte derangements, ACS, considered pneumonia, viral illness, mild dehydration considered PE but she is not on active chemo, no recent travel or surgeries, no unilateral swelling, is on Eliquis, will get a D-dimer, labs, troponin, chest x-ray, UA will give her a little bit IV fluids as well as her home Dilt.  Patient's presentation is most consistent with acute presentation with potential threat to life or bodily function.  Bedside ultrasound showed normal contractility, no obvious pericardial effusion, RV less than LV, also no B-lines on exam.  Patient states that shortness of breath felt a lot better after her heart rate came down.  Suspect her symptoms were due to A-fib with RVR.  Patient was given her home Dilt as well as 25 mg of metoprolol, she supposed to take 12.5 mg twice daily.  Went back to reassess her, she states that she is feeling good, heart rate  now in the 80s to low 90s.    Independent review of labs and imaging and clinical course are below.  Patient was observed in the emergency department for several hours  without any additional medications required.  Cardiologist recommended increasing metoprolol to 25 mg twice daily.  He will connect with [facility] clinic to see if they can schedule her with a closer outpatient follow-up.  Considered but no indication for inpatient admission at this time, she is safe for outpatient management.  Will discharge with strict return precautions.  Her antibiotics were sent to her pharmacy.  Clinical Course as of 10/27/23 1228  Sat Oct 27, 2023  0922 On independent review of labs, no leukocytosis, electrolytes lites not severely deranged, LFTs are normal, troponin and D-dimer are negative, mag is not elevated. [TT]  [phone] Independent review of labs, she has a mild AKI, electrolytes not severely deranged, LFTs are normal, no leukocytosis, troponin is negative, mag is normal, respiratory viral panel is negative, D-dimer is not elevated.  Her BNP is mildly elevated but chest x-ray without any focal consolidation or evidence of pulmonary edema, she is also does not appear fluid overloaded on exam. [TT]  1116 UA analysis shows a mild urinary tract infection.  Will start her on some Keflex. [TT]  1134 Troponin x 2 is not elevated. [TT]  1136 On reassessment patient states that she is feeling well, asymptomatic at this time.  Heart rate is between 80s to low 100s.  Discussed with her about the laboratory findings as well as her mild UTI. [TT]  1137 Will reach out to cardiology to see if they want any changes to her medication for her A-fib prior to sending her home. [TT]  1157 On reassessment, her heart rates in the 70s, systolic blood pressures in the 130s.  Still asymptomatic.  Consulted cardiologist to see if they would recommend changing her medication and increasing her metoprolol.  He states that okay to increase to  25 mg twice daily.  Will send him a secure chat so that he can schedule for for more expedited follow-up. [TT]  1158 For the metoprolol, will have her check her blood pressure to make sure that systolics are above 100 before she takes the 25 mg at night. [TT]    Clinical Course User Index [TT] Jodie Echevaria Franchot Erichsen, MD     FINAL CLINICAL IMPRESSION(S) / ED DIAGNOSES   Final diagnoses:  Atrial fibrillation, unspecified type (HCC)  Shortness of breath  Urinary tract infection without hematuria, site unspecified     Rx / DC Orders   ED Discharge Orders          Ordered    cephALEXin (KEFLEX) 500 MG capsule  4 times daily        10/27/23 1117             Note:  This document was prepared using Dragon voice recognition software and may include unintentional dictation errors.     Claybon Jabs, MD 10/27/23 [phone]

## 2023-10-27 NOTE — Discharge Instructions (Addendum)
 Please increase your metoprolol to 25 mg twice a day instead of 12.5 mg twice a day.  You already have the 25 mg tablets.  Please do not take the blood pressure medication if your blood pressures are under a systolic blood pressure of 100.  Please also take the antibiotics as prescribed for your urinary tract infection.  I have spoken to the cardiologist on-call for the clinic that he go to, they will schedule a follow-up appointment with you, if you do not hear back from them early next week, please give them a call to see when your follow-up appointment is.

## 2023-10-27 NOTE — ED Triage Notes (Signed)
 Patient arriving from [facility]. Woke up this morning with SOB. Feels like she can't take a deep breath. Has happened about 1 year ago. Hx of A-fib. EMS state S-fib with RVR. Patient has not had her medications she takes Cardizem and Eliquis. Patient states she has a headache but also states she has a hx of headaches. Last time this episode happened she did not have her medications. CBG: 114. Patient AOX4

## 2023-10-28 LAB — URINE CULTURE

## 2023-11-21 ENCOUNTER — Other Ambulatory Visit (INDEPENDENT_AMBULATORY_CARE_PROVIDER_SITE_OTHER): Payer: Self-pay | Admitting: Vascular Surgery

## 2023-11-21 DIAGNOSIS — I701 Atherosclerosis of renal artery: Secondary | ICD-10-CM

## 2023-11-22 ENCOUNTER — Encounter (INDEPENDENT_AMBULATORY_CARE_PROVIDER_SITE_OTHER): Payer: Self-pay | Admitting: Nurse Practitioner

## 2023-11-22 ENCOUNTER — Ambulatory Visit (INDEPENDENT_AMBULATORY_CARE_PROVIDER_SITE_OTHER): Payer: Medicare Other | Admitting: Nurse Practitioner

## 2023-11-22 ENCOUNTER — Ambulatory Visit (INDEPENDENT_AMBULATORY_CARE_PROVIDER_SITE_OTHER): Payer: Medicare Other

## 2023-11-22 VITALS — BP 152/81 | HR 65 | Resp 18 | Ht 63.0 in | Wt 128.4 lb

## 2023-11-22 DIAGNOSIS — I701 Atherosclerosis of renal artery: Secondary | ICD-10-CM | POA: Diagnosis not present

## 2023-11-22 DIAGNOSIS — I10 Essential (primary) hypertension: Secondary | ICD-10-CM

## 2023-11-25 NOTE — Progress Notes (Signed)
 Subjective:    Patient ID: Laurie Shields, female    DOB: 1932-10-13, 89 y.o.   MRN: [id number] Chief Complaint  Patient presents with   Follow-up    f/u in 1 year with renal    The patient returns to the office for followup and review of the noninvasive studies regarding renal vascular hypertension and renal artery stenosis. There have been no interval changes in the patient's blood pressure control.  The patient denies any major changes in their medications.  The patient denies headache or flushing.  No flank or unusual back pain.    There have been no significant changes to the patient's overall health care.  No recent shortening of the patient's walking distance or new symptoms consistent with claudication.  No history of rest pain symptoms. No new ulcers or wounds of the lower extremities have occurred.  The patient denies amaurosis fugax or recent TIA symptoms. There are no recent neurological changes noted. There is no history of DVT, PE or superficial thrombophlebitis. No recent episodes of angina or shortness of breath documented.   Duplex ultrasound of the bilateral renal arteries shows no evidence of significant renal artery stenosis.  Velocities are decreased from previous studies which showed 1 to 59% stenosis.     Review of Systems  All other systems reviewed and are negative.      Objective:   Physical Exam Vitals reviewed.  HENT:     Head: Normocephalic.  Neck:     Vascular: No carotid bruit.  Cardiovascular:     Rate and Rhythm: Normal rate.  Pulmonary:     Effort: Pulmonary effort is normal.  Skin:    General: Skin is warm and dry.  Neurological:     Mental Status: She is alert and oriented to person, place, and time.  Psychiatric:        Mood and Affect: Mood normal.        Behavior: Behavior normal.        Thought Content: Thought content normal.        Judgment: Judgment normal.     BP (!) 152/81   Pulse 65   Resp 18   Ht 5\' 3"  (1.6 m)   Wt  128 lb 6.4 oz (58.2 kg)   BMI 22.75 kg/m   Past Medical History:  Diagnosis Date   Atrial fibrillation (HCC)    Cancer (HCC)    melanoma   Melanoma (HCC)    right ankle   Melanoma in situ (HCC) bx 08/03/20   right upper forearm, EXC 09/08/2020, margins free   SCC (squamous cell carcinoma) 08/11/2021   R pretibia, MOHs completed 09/22/2021   SCC (squamous cell carcinoma) 08/11/2021   R upper back/base of neck, EDC 09/07/2021   Squamous cell carcinoma of skin 03/11/2019   right mid pretibia (tx with [facility] 7/20)    Social History   Socioeconomic History   Marital status: Married    Spouse name: Not on file   Number of children: Not on file   Years of education: Not on file   Highest education level: Not on file  Occupational History   Not on file  Tobacco Use   Smoking status: Never    Passive exposure: Never   Smokeless tobacco: Never  Substance and Sexual Activity   Alcohol use: Not Currently   Drug use: Not Currently   Sexual activity: Not Currently    Birth control/protection: Post-menopausal  Other Topics Concern   Not on file  Social History Narrative   Not on file   Social Drivers of Health   Financial Resource Strain: Low Risk  (11/06/2023)   Received from [facility]   Overall Financial Resource Strain (CARDIA)    Difficulty of Paying Living Expenses: Not hard at all  Food Insecurity: No Food Insecurity (11/06/2023)   Received from [facility]   Hunger Vital Sign    Worried About Running Out of Food in the Last Year: Never true    Ran Out of Food in the Last Year: Never true  Transportation Needs: No Transportation Needs (11/06/2023)   Received from [facility] - Transportation    In the past 12 months, has lack of transportation kept you from medical appointments or from getting medications?: No    Lack of Transportation (Non-Medical): No  Physical Activity: Not on file  Stress: Not on file   Social Connections: Not on file  Intimate Partner Violence: Not At Risk (12/18/2022)   Humiliation, Afraid, Rape, and Kick questionnaire    Fear of Current or Ex-Partner: No    Emotionally Abused: No    Physically Abused: No    Sexually Abused: No    Past Surgical History:  Procedure Laterality Date   CHOLECYSTECTOMY     MELANOMA EXCISION     VULVECTOMY      Family History  Problem Relation Age of Onset   Cancer Mother    Heart attack Mother    Alzheimer's disease Father    Breast cancer Neg Hx     Allergies  Allergen Reactions   Trazodone Other (See Comments) and Nausea Only    Blurred vision Other reaction(s): Other (See Comments) Blurred vision       Latest Ref Rng & Units 10/27/2023    8:45 AM 12/22/2022    4:41 AM 12/21/2022    4:57 AM  CBC  WBC 4.0 - 10.5 K/uL 6.9     Hemoglobin 12.0 - 15.0 g/dL 95.2  8.2  8.4   Hematocrit 36.0 - 46.0 % 39.4     Platelets 150 - 400 K/uL 208         CMP     Component Value Date/Time   NA 140 10/27/2023 0845   K 4.3 10/27/2023 0845   CL 106 10/27/2023 0845   CO2 26 10/27/2023 0845   GLUCOSE 120 (H) 10/27/2023 0845   BUN 24 (H) 10/27/2023 0845   CREATININE 1.04 (H) 10/27/2023 0845   CALCIUM 9.3 10/27/2023 0845   PROT 6.5 10/27/2023 0845   ALBUMIN 3.7 10/27/2023 0845   AST 16 10/27/2023 0845   ALT 16 10/27/2023 0845   ALKPHOS 72 10/27/2023 0845   BILITOT 0.9 10/27/2023 0845   GFRNONAA 52 (L) 10/27/2023 0845     No results found.     Assessment & Plan:   1. Renal artery stenosis (HCC) (Primary) Recommend:  Given patient's arterial disease optimal control of the patient's hypertension is important. BP is acceptable today  The patient's vital signs and noninvasive studies support the renal artery stenosis is not significantly increased when compared to the previous study.  No invasive studies or intervention is indicated at this time.  The patient will continue the current antihypertensive medications, no  changes at this time.  The primary medical service will continue aggressive antihypertensive therapy as per the AHA guidelines.  Patient will follow-up with duplex ultrasound of the renal arteries as ordered.   2. Benign essential  HTN Continue antihypertensive medications as already ordered, these medications have been reviewed and there are no changes at this time.   Current Outpatient Medications on File Prior to Visit  Medication Sig Dispense Refill   acetaminophen (TYLENOL) 500 MG tablet Take 1,000 mg by mouth every 8 (eight) hours as needed for headache.     apixaban (ELIQUIS) 2.5 MG TABS tablet Take 1 tablet by mouth every 12 (twelve) hours.     CARTIA XT 120 MG 24 hr capsule Take 120 mg by mouth 2 (two) times daily.     Cholecalciferol (VITAMIN D3) 50 MCG (2000 UT) capsule Take 2,000 Units by mouth daily.     clobetasol ointment (TEMOVATE) 0.05 % Apply 1 Application topically as directed. Apply to the affected area 1-2 times weekly.     co-enzyme Q-10 30 MG capsule Take 30 mg by mouth daily.      ELIQUIS 2.5 MG TABS tablet Take 1 tablet (2.5 mg total) by mouth daily. 60 tablet 0   metoprolol tartrate (LOPRESSOR) 25 MG tablet Take 1 tablet by mouth 2 (two) times daily.     Multiple Vitamin (MULTIVITAMIN) tablet Take 1 tablet by mouth daily.     pantoprazole (PROTONIX) 40 MG tablet Take 40 mg by mouth daily.     prednisoLONE acetate (PRED FORTE) 1 % ophthalmic suspension Place 1 drop into the left eye in the morning and at bedtime.     No current facility-administered medications on file prior to visit.    There are no Patient Instructions on file for this visit. No follow-ups on file.   Georgiana Spinner, NP

## 2024-01-01 ENCOUNTER — Ambulatory Visit: Payer: Medicare Other | Admitting: Dermatology

## 2024-01-01 DIAGNOSIS — L578 Other skin changes due to chronic exposure to nonionizing radiation: Secondary | ICD-10-CM

## 2024-01-01 DIAGNOSIS — L814 Other melanin hyperpigmentation: Secondary | ICD-10-CM | POA: Diagnosis not present

## 2024-01-01 DIAGNOSIS — D1801 Hemangioma of skin and subcutaneous tissue: Secondary | ICD-10-CM

## 2024-01-01 DIAGNOSIS — Z85820 Personal history of malignant melanoma of skin: Secondary | ICD-10-CM

## 2024-01-01 DIAGNOSIS — W908XXA Exposure to other nonionizing radiation, initial encounter: Secondary | ICD-10-CM | POA: Diagnosis not present

## 2024-01-01 DIAGNOSIS — D229 Melanocytic nevi, unspecified: Secondary | ICD-10-CM

## 2024-01-01 DIAGNOSIS — Z86006 Personal history of melanoma in-situ: Secondary | ICD-10-CM

## 2024-01-01 DIAGNOSIS — Z1283 Encounter for screening for malignant neoplasm of skin: Secondary | ICD-10-CM

## 2024-01-01 DIAGNOSIS — D225 Melanocytic nevi of trunk: Secondary | ICD-10-CM

## 2024-01-01 DIAGNOSIS — Z85828 Personal history of other malignant neoplasm of skin: Secondary | ICD-10-CM

## 2024-01-01 DIAGNOSIS — D3617 Benign neoplasm of peripheral nerves and autonomic nervous system of trunk, unspecified: Secondary | ICD-10-CM

## 2024-01-01 DIAGNOSIS — L821 Other seborrheic keratosis: Secondary | ICD-10-CM

## 2024-01-01 DIAGNOSIS — D3610 Benign neoplasm of peripheral nerves and autonomic nervous system, unspecified: Secondary | ICD-10-CM

## 2024-01-01 DIAGNOSIS — L820 Inflamed seborrheic keratosis: Secondary | ICD-10-CM

## 2024-01-01 DIAGNOSIS — D692 Other nonthrombocytopenic purpura: Secondary | ICD-10-CM

## 2024-01-01 DIAGNOSIS — D3615 Benign neoplasm of peripheral nerves and autonomic nervous system of abdomen: Secondary | ICD-10-CM

## 2024-01-01 NOTE — Patient Instructions (Addendum)

## 2024-01-01 NOTE — Progress Notes (Signed)
 Follow-Up Visit   Subjective  Laurie Shields is a 89 y.o. female who presents for the following: Skin Cancer Screening and Full Body Skin Exam  The patient presents for Total-Body Skin Exam (TBSE) for skin cancer screening and mole check. The patient has spots, moles and lesions to be evaluated, some may be new or changing. She has a spot on her left upper abdomen, present for 2-3 months. History of melanoma, melanoma in situ, SCC.    The following portions of the chart were reviewed this encounter and updated as appropriate: medications, allergies, medical history  Review of Systems:  No other skin or systemic complaints except as noted in HPI or Assessment and Plan.  Objective  Well appearing patient in no apparent distress; mood and affect are within normal limits.  A full examination was performed including scalp, head, eyes, ears, nose, lips, neck, chest, axillae, abdomen, back, buttocks, bilateral upper extremities, bilateral lower extremities, hands, feet, fingers, toes, fingernails, and toenails. All findings within normal limits unless otherwise noted below.   Relevant physical exam findings are noted in the Assessment and Plan.  Left Abdomen x 1 Erythematous stuck-on, waxy papule R lat ankle x 1, R pretibia x 2 (3) Keratotic pink papules  Assessment & Plan   SKIN CANCER SCREENING PERFORMED TODAY.  ACTINIC DAMAGE - Chronic condition, secondary to cumulative UV/sun exposure - diffuse scaly erythematous macules with underlying dyspigmentation - Recommend daily broad spectrum sunscreen SPF 30+ to sun-exposed areas, reapply every 2 hours as needed.  - Staying in the shade or wearing long sleeves, sun glasses (UVA+UVB protection) and wide brim hats (4-inch brim around the entire circumference of the hat) are also recommended for sun protection.  - Call for new or changing lesions.  LENTIGINES, SEBORRHEIC KERATOSES, HEMANGIOMAS - Benign normal skin lesions -  Benign-appearing - Call for any changes  MELANOCYTIC NEVI - Tan-brown and/or pink-flesh-colored symmetric macules and papules - Left abdomen 4mm tan macule with darker edge, stable compared to photo 05/31/22 - Benign appearing on exam today - Observation - Call clinic for new or changing moles - Recommend daily use of broad spectrum spf 30+ sunscreen to sun-exposed areas.   HISTORY OF MELANOMA IN SITU Right upper forearm, Excised 09/08/2020 - No evidence of recurrence today - Recommend regular full body skin exams - Recommend daily broad spectrum sunscreen SPF 30+ to sun-exposed areas, reapply every 2 hours as needed.  - Call if any new or changing lesions are noted between office visits   HISTORY OF MELANOMA Right ankle - No evidence of recurrence today - Recommend regular full body skin exams - Recommend daily broad spectrum sunscreen SPF 30+ to sun-exposed areas, reapply every 2 hours as needed.  - Call if any new or changing lesions are noted between office visits   HISTORY OF SQUAMOUS CELL CARCINOMA OF THE SKIN Right pretibia, 2023 Right upper back/base of neck, 2023 Right mid pretibia, 2020 - No evidence of recurrence today - No lymphadenopathy - Recommend regular full body skin exams - Recommend daily broad spectrum sunscreen SPF 30+ to sun-exposed areas, reapply every 2 hours as needed.  - Call if any new or changing lesions are noted between office visits   Lentigo vs Flat SK  - left clavicle 1.4 x 1.2cm tan patch  Benign-appearing. Stable compared to photo 05/31/22. Observation.  Call clinic for new or changing moles.  Recommend daily use of broad spectrum spf 30+ sunscreen to sun-exposed areas.   Seborrheic keratosis - right  lateral upper ankle 1.0cm two tone waxy tan macule   Benign-appearing. Stable compared to photo 05/31/22. Observation.  Call clinic for new or changing moles.  Recommend daily use of broad spectrum spf 30+ sunscreen to sun-exposed areas.    NEUROFIBROMA Exam:  Soft flesh domed papules on the left abdomen, left flank  Benign-appearing.  Observation.  Call clinic for new or changing lesions.  Recommend daily use of broad spectrum spf 30+ sunscreen to sun-exposed areas.   Purpura - Chronic; persistent and recurrent.  Treatable, but not curable. - Violaceous macules and patches - Benign - Related to trauma, age, sun damage and/or use of blood thinners, chronic use of topical and/or oral steroids - Observe - Can use OTC arnica containing moisturizer such as Dermend Bruise Formula if desired - Call for worsening or other concerns  INFLAMED SEBORRHEIC KERATOSIS Left Abdomen x 1 Symptomatic, irritating, patient would like treated. Destruction of lesion - Left Abdomen x 1  Destruction method: cryotherapy   Informed consent: discussed and consent obtained   Lesion destroyed using liquid nitrogen: Yes   Region frozen until ice ball extended beyond lesion: Yes   Outcome: patient tolerated procedure well with no complications   Post-procedure details: wound care instructions given   Additional details:  Prior to procedure, discussed risks of blister formation, small wound, skin dyspigmentation, or rare scar following cryotherapy. Recommend Vaseline ointment to treated areas while healing.  SEBORRHEIC KERATOSIS, INFLAMED (3) R lat ankle x 1, R pretibia x 2 (3) vs Hypertrophic AK Destruction of lesion - R lat ankle x 1, R pretibia x 2 (3)  Destruction method: cryotherapy   Informed consent: discussed and consent obtained   Lesion destroyed using liquid nitrogen: Yes   Region frozen until ice ball extended beyond lesion: Yes   Outcome: patient tolerated procedure well with no complications   Post-procedure details: wound care instructions given   Additional details:  Prior to procedure, discussed risks of blister formation, small wound, skin dyspigmentation, or rare scar following cryotherapy. Recommend Vaseline ointment to  treated areas while healing.  Return in about 6 months (around 07/02/2024) for TBSE, Hx melanoma, Hx SCC.  IBernardine Bridegroom, CMA, am acting as scribe for Artemio Larry, MD .   Documentation: I have reviewed the above documentation for accuracy and completeness, and I agree with the above.  Artemio Larry, MD

## 2024-01-22 ENCOUNTER — Encounter (INDEPENDENT_AMBULATORY_CARE_PROVIDER_SITE_OTHER): Payer: Self-pay

## 2024-02-29 ENCOUNTER — Ambulatory Visit
Admission: RE | Admit: 2024-02-29 | Discharge: 2024-02-29 | Disposition: A | Discharge status: Home / Self Care | Source: Ambulatory Visit | Attending: Urology | Admitting: Urology

## 2024-02-29 DIAGNOSIS — N2889 Other specified disorders of kidney and ureter: Secondary | ICD-10-CM | POA: Diagnosis present

## 2024-02-29 MED ORDER — IOHEXOL 300 MG/ML  SOLN
100.0000 mL | Freq: Once | INTRAMUSCULAR | Status: AC | PRN
  Administered 2024-02-29: 100 mL via INTRAVENOUS

## 2024-03-07 ENCOUNTER — Other Ambulatory Visit: Payer: Self-pay | Admitting: Urology

## 2024-03-07 DIAGNOSIS — N2889 Other specified disorders of kidney and ureter: Secondary | ICD-10-CM

## 2024-03-08 ENCOUNTER — Ambulatory Visit: Payer: Self-pay | Admitting: Urology

## 2024-03-13 ENCOUNTER — Ambulatory Visit: Admitting: Urology

## 2024-03-19 ENCOUNTER — Ambulatory Visit: Admitting: Urology

## 2024-03-19 ENCOUNTER — Ambulatory Visit: Payer: Self-pay | Admitting: Urology

## 2024-04-03 ENCOUNTER — Ambulatory Visit: Admitting: Urology

## 2024-04-09 ENCOUNTER — Ambulatory Visit: Admitting: Urology

## 2024-04-09 ENCOUNTER — Encounter: Payer: Self-pay | Admitting: Urology

## 2024-04-09 VITALS — BP 112/68 | HR 72 | Ht 63.0 in | Wt 125.0 lb

## 2024-04-09 DIAGNOSIS — N289 Disorder of kidney and ureter, unspecified: Secondary | ICD-10-CM | POA: Diagnosis not present

## 2024-04-09 DIAGNOSIS — R351 Nocturia: Secondary | ICD-10-CM

## 2024-04-09 NOTE — Progress Notes (Signed)
   04/09/2024 8:57 AM   Laurie Shields Jan 09, 1933 [id number]  Reason for visit: Follow up left renal lesion, nocturia  HPI: 89 year old female with history of melanoma and mesenteric ischemia who was found to have a 7 mm left exophytic indeterminate renal lesion on CT from September 2023, this was new from a prior CT stone protocol in 2020.  Renal function borderline with creatinine 1.2, EGFR 43.  At our original visit in January 2024 she opted for active surveillance.  Lesion was stable on CT from July 2024.    I personally viewed and interpreted the most recent CT from 02/29/2024 showing a stable 7 mm hemorrhagic cyst, no concern for malignancy.  Reassurance was provided, no further imaging needed.  She also reports nocturia 5-6 times per night that has been present since she has had lower extremity edema.  We discussed behavioral strategies including minimizing fluids prior to bedtime, lower extremity compression socks, elevation of the legs in the afternoon.  Also recommended she discuss with PCP consideration of some afternoon Lasix if not improved with the above strategies  Follow-up with urology as needed  Laurie JAYSON Burnet, MD  [facility] [address] [place], [place] [address] [phone]

## 2024-04-09 NOTE — Patient Instructions (Signed)

## 2024-07-22 ENCOUNTER — Encounter: Payer: Self-pay | Admitting: Dermatology

## 2024-07-22 ENCOUNTER — Ambulatory Visit: Admitting: Dermatology

## 2024-07-22 DIAGNOSIS — L578 Other skin changes due to chronic exposure to nonionizing radiation: Secondary | ICD-10-CM

## 2024-07-22 DIAGNOSIS — L820 Inflamed seborrheic keratosis: Secondary | ICD-10-CM

## 2024-07-22 DIAGNOSIS — Z86006 Personal history of melanoma in-situ: Secondary | ICD-10-CM

## 2024-07-22 DIAGNOSIS — Z85820 Personal history of malignant melanoma of skin: Secondary | ICD-10-CM

## 2024-07-22 DIAGNOSIS — Z85828 Personal history of other malignant neoplasm of skin: Secondary | ICD-10-CM

## 2024-07-22 DIAGNOSIS — D225 Melanocytic nevi of trunk: Secondary | ICD-10-CM

## 2024-07-22 DIAGNOSIS — Z1283 Encounter for screening for malignant neoplasm of skin: Secondary | ICD-10-CM

## 2024-07-22 DIAGNOSIS — S81801A Unspecified open wound, right lower leg, initial encounter: Secondary | ICD-10-CM

## 2024-07-22 DIAGNOSIS — W908XXA Exposure to other nonionizing radiation, initial encounter: Secondary | ICD-10-CM

## 2024-07-22 DIAGNOSIS — L989 Disorder of the skin and subcutaneous tissue, unspecified: Secondary | ICD-10-CM

## 2024-07-22 DIAGNOSIS — L814 Other melanin hyperpigmentation: Secondary | ICD-10-CM

## 2024-07-22 DIAGNOSIS — L821 Other seborrheic keratosis: Secondary | ICD-10-CM

## 2024-07-22 DIAGNOSIS — D692 Other nonthrombocytopenic purpura: Secondary | ICD-10-CM

## 2024-07-22 DIAGNOSIS — D235 Other benign neoplasm of skin of trunk: Secondary | ICD-10-CM

## 2024-07-22 DIAGNOSIS — L570 Actinic keratosis: Secondary | ICD-10-CM

## 2024-07-22 DIAGNOSIS — D229 Melanocytic nevi, unspecified: Secondary | ICD-10-CM

## 2024-07-22 DIAGNOSIS — D1801 Hemangioma of skin and subcutaneous tissue: Secondary | ICD-10-CM

## 2024-07-22 NOTE — Progress Notes (Signed)
 Follow-Up Visit   Subjective  Laurie Shields is a 89 y.o. female who presents for the following: Skin Cancer Screening and Full Body Skin Exam. History of melanoma, melanoma in situ, SCC.  The patient presents for Total-Body Skin Exam (TBSE) for skin cancer screening and mole check. The patient has spots, moles and lesions to be evaluated, some may be new or changing.  Some spots are irritating and itchy at upper back and scalp   The following portions of the chart were reviewed this encounter and updated as appropriate: medications, allergies, medical history  Review of Systems:  No other skin or systemic complaints except as noted in HPI or Assessment and Plan.  Objective  Well appearing patient in no apparent distress; mood and affect are within normal limits.  A full examination was performed including scalp, head, eyes, ears, nose, lips, neck, chest, axillae, abdomen, back, buttocks, bilateral upper extremities, bilateral lower extremities, hands, feet, fingers, toes, fingernails, and toenails. All findings within normal limits unless otherwise noted below.   Relevant physical exam findings are noted in the Assessment and Plan.  Spinal Mid Upper Back x4, Frontal scalp x1 (5) Erythematous keratotic or waxy stuck-on papule or plaque. Lower sternum x1, L chest x2, R sternum x1, L arm x5, R arm x2 (11) Erythematous thin papules/macules with gritty scale.   Assessment & Plan   SKIN CANCER SCREENING PERFORMED TODAY.  ACTINIC DAMAGE - Chronic condition, secondary to cumulative UV/sun exposure - diffuse scaly erythematous macules with underlying dyspigmentation - Recommend daily broad spectrum sunscreen SPF 30+ to sun-exposed areas, reapply every 2 hours as needed.  - Staying in the shade or wearing long sleeves, sun glasses (UVA+UVB protection) and wide brim hats (4-inch brim around the entire circumference of the hat) are also recommended for sun protection.  - Call for new or  changing lesions.  LENTIGINES, SEBORRHEIC KERATOSES, HEMANGIOMAS - Benign normal skin lesions - Benign-appearing - Call for any changes  MELANOCYTIC NEVI - Tan-brown and/or pink-flesh-colored symmetric macules and papules - Left abdomen 4 mm tan macule with darker edge, stable compared to photo 05/31/22 - Benign appearing on exam today - Observation - Call clinic for new or changing moles - Recommend daily use of broad spectrum spf 30+ sunscreen to sun-exposed areas.   HISTORY OF MELANOMA IN SITU Right upper forearm, Excised 09/08/2020 - No evidence of recurrence today - Recommend regular full body skin exams - Recommend daily broad spectrum sunscreen SPF 30+ to sun-exposed areas, reapply every 2 hours as needed.  - Call if any new or changing lesions are noted between office visits   HISTORY OF MELANOMA Right ankle - No evidence of recurrence today - Recommend regular full body skin exams - Recommend daily broad spectrum sunscreen SPF 30+ to sun-exposed areas, reapply every 2 hours as needed.  - Call if any new or changing lesions are noted between office visits   HISTORY OF SQUAMOUS CELL CARCINOMA OF THE SKIN Right pretibia, 2023 Right upper back/base of neck, 2023 Right mid pretibia, 2020 - No evidence of recurrence today - Recommend regular full body skin exams - Recommend daily broad spectrum sunscreen SPF 30+ to sun-exposed areas, reapply every 2 hours as needed.  - Call if any new or changing lesions are noted between office visits   Lentigo vs Flat SK Exam:  - left clavicle 1.4 x 1.2 cm tan patch  Benign-appearing. Stable compared to photo 05/31/22. Observation.  Call clinic for new or changing moles.  Recommend  daily use of broad spectrum spf 30+ sunscreen to sun-exposed areas.   Seborrheic keratosis - right lateral upper ankle 1.0cm two tone waxy tan macule  - 4 mm waxy brown macule at R lower back  Benign-appearing. Stable compared to photo 05/31/22. Observation.   Call clinic for new or changing moles.  Recommend daily use of broad spectrum spf 30+ sunscreen to sun-exposed areas.   NEUROFIBROMA Exam:  Soft flesh domed papules on the left abdomen, left flank  Benign-appearing.  Observation.  Call clinic for new or changing lesions.  Recommend daily use of broad spectrum spf 30+ sunscreen to sun-exposed areas.   Purpura - Chronic; persistent and recurrent.  Treatable, but not curable. - Violaceous macules and patches - Benign - Related to trauma, age, sun damage and/or use of blood thinners, chronic use of topical and/or oral steroids - Observe - Can use OTC arnica containing moisturizer such as Dermend Bruise Formula if desired - Call for worsening or other concerns  Healing wound Exam: Heme crusted abrasion at R lower pretibia  Treatment: Wash daily, apply Neosporin, cover with bandage until healed.   SCHAMBERG'S PIGMENTED PURPURA Exam: cayenne-pepper-like macules with associated golden-brown pigmentation of lower legs.  +/- lower leg edema  Schamberg's purpura is a benign chronic condition that may also have intermittent episodes of worsening/improvement.  It is the most common type of pigmented purpura and is typically asymptomatic.  It generally affects older individuals, and may be related to leg swelling, increased activity, or excessive alcohol use.  Sometimes short term treatment with a mid-potency topical steroid is given for acute flares.  Treatment Plan: Benign, observe.   Recommend daily graduated compression hose/stockings- easiest to put on first thing in morning, remove at bedtime.    INFLAMED SEBORRHEIC KERATOSIS (5) Spinal Mid Upper Back x4, Frontal scalp x1 (5) Symptomatic, irritating, patient would like treated.   Recommend OTC Gold Bond Rapid Relief Anti-Itch cream (pramoxine + menthol), CeraVe Anti-itch cream or lotion (pramoxine), Sarna lotion (Original- menthol + camphor or Sensitive- pramoxine) or Eucerin 12 hour Itch  Relief lotion (menthol) up to 3 times per day to areas on body that are itchy.  Destruction of lesion - Spinal Mid Upper Back x4, Frontal scalp x1 (5)  Destruction method: cryotherapy   Informed consent: discussed and consent obtained   Lesion destroyed using liquid nitrogen: Yes   Region frozen until ice ball extended beyond lesion: Yes   Outcome: patient tolerated procedure well with no complications   Post-procedure details: wound care instructions given   Additional details:  Prior to procedure, discussed risks of blister formation, small wound, skin dyspigmentation, or rare scar following cryotherapy. Recommend Vaseline ointment to treated areas while healing.   AK (ACTINIC KERATOSIS) (11) Lower sternum x1, L chest x2, R sternum x1, L arm x5, R arm x2 (11) VS ISKs   Actinic keratoses are precancerous spots that appear secondary to cumulative UV radiation exposure/sun exposure over time. They are chronic with expected duration over 1 year. A portion of actinic keratoses will progress to squamous cell carcinoma of the skin. It is not possible to reliably predict which spots will progress to skin cancer and so treatment is recommended to prevent development of skin cancer.  Recommend daily broad spectrum sunscreen SPF 30+ to sun-exposed areas, reapply every 2 hours as needed.  Recommend staying in the shade or wearing long sleeves, sun glasses (UVA+UVB protection) and wide brim hats (4-inch brim around the entire circumference of the hat). Call for new  or changing lesions. Destruction of lesion - Lower sternum x1, L chest x2, R sternum x1, L arm x5, R arm x2 (11)  Destruction method: cryotherapy   Informed consent: discussed and consent obtained   Lesion destroyed using liquid nitrogen: Yes   Region frozen until ice ball extended beyond lesion: Yes   Outcome: patient tolerated procedure well with no complications   Post-procedure details: wound care instructions given   Additional  details:  Prior to procedure, discussed risks of blister formation, small wound, skin dyspigmentation, or rare scar following cryotherapy. Recommend Vaseline ointment to treated areas while healing.   Return in about 6 months (around 01/19/2025) for TBSE, HxMM, HxMIS, HxSCC.  I, Jill Parcell, CMA, am acting as scribe for Rexene Rattler, MD.    Documentation: I have reviewed the above documentation for accuracy and completeness, and I agree with the above.  Rexene Rattler, MD

## 2024-07-22 NOTE — Patient Instructions (Addendum)
 For Chest: Recommend OTC Gold Bond Rapid Relief Anti-Itch cream (pramoxine + menthol), CeraVe Anti-itch cream or lotion (pramoxine), Sarna lotion (Original- menthol + camphor or Sensitive- pramoxine) or Eucerin 12 hour Itch Relief lotion (menthol) up to 3 times per day to areas on body that are itchy.     Recommend daily broad spectrum sunscreen SPF 30+ to sun-exposed areas, reapply every 2 hours as needed. Call for new or changing lesions.  Staying in the shade or wearing long sleeves, sun glasses (UVA+UVB protection) and wide brim hats (4-inch brim around the entire circumference of the hat) are also recommended for sun protection.      Melanoma ABCDEs  Melanoma is the most dangerous type of skin cancer, and is the leading cause of death from skin disease.  You are more likely to develop melanoma if you: Have light-colored skin, light-colored eyes, or red or blond hair Spend a lot of time in the sun Tan regularly, either outdoors or in a tanning bed Have had blistering sunburns, especially during childhood Have a close family member who has had a melanoma Have atypical moles or large birthmarks  Early detection of melanoma is key since treatment is typically straightforward and cure rates are extremely high if we catch it early.   The first sign of melanoma is often a change in a mole or a new dark spot.  The ABCDE system is a way of remembering the signs of melanoma.  A for asymmetry:  The two halves do not match. B for border:  The edges of the growth are irregular. C for color:  A mixture of colors are present instead of an even brown color. D for diameter:  Melanomas are usually (but not always) greater than 6mm - the size of a pencil eraser. E for evolution:  The spot keeps changing in size, shape, and color.  Please check your skin once per month between visits. You can use a small mirror in front and a large mirror behind you to keep an eye on the back side or your body.    If you see any new or changing lesions before your next follow-up, please call to schedule a visit.  Please continue daily skin protection including broad spectrum sunscreen SPF 30+ to sun-exposed areas, reapplying every 2 hours as needed when you're outdoors.   Staying in the shade or wearing long sleeves, sun glasses (UVA+UVB protection) and wide brim hats (4-inch brim around the entire circumference of the hat) are also recommended for sun protection.      Due to recent changes in healthcare laws, you may see results of your pathology and/or laboratory studies on MyChart before the doctors have had a chance to review them. We understand that in some cases there may be results that are confusing or concerning to you. Please understand that not all results are received at the same time and often the doctors may need to interpret multiple results in order to provide you with the best plan of care or course of treatment. Therefore, we ask that you please give us  2 business days to thoroughly review all your results before contacting the office for clarification. Should we see a critical lab result, you will be contacted sooner.   If You Need Anything After Your Visit  If you have any questions or concerns for your doctor, please call our main line at [phone] and press option 4 to reach your doctor's medical assistant. If no one answers, please leave a  voicemail as directed and we will return your call as soon as possible. Messages left after 4 pm will be answered the following business day.   You may also send us  a message via MyChart. We typically respond to MyChart messages within 1-2 business days.  For prescription refills, please ask your pharmacy to contact our office. Our fax number is [phone].  If you have an urgent issue when the clinic is closed that cannot wait until the next business day, you can page your doctor at the number below.    Please note that while we do our  best to be available for urgent issues outside of office hours, we are not available 24/7.   If you have an urgent issue and are unable to reach us , you may choose to seek medical care at your doctor's office, retail clinic, urgent care center, or emergency room.  If you have a medical emergency, please immediately call 911 or go to the emergency department.  Pager Numbers  - Dr. Hester: [phone]  - Dr. Jackquline: [phone]  - Dr. Claudene: [phone]   - Dr. Raymund: [phone]  In the event of inclement weather, please call our main line at [phone] for an update on the status of any delays or closures.  Dermatology Medication Tips: Please keep the boxes that topical medications come in in order to help keep track of the instructions about where and how to use these. Pharmacies typically print the medication instructions only on the boxes and not directly on the medication tubes.   If your medication is too expensive, please contact our office at [phone] option 4 or send us  a message through MyChart.   We are unable to tell what your co-pay for medications will be in advance as this is different depending on your insurance coverage. However, we may be able to find a substitute medication at lower cost or fill out paperwork to get insurance to cover a needed medication.   If a prior authorization is required to get your medication covered by your insurance company, please allow us  1-2 business days to complete this process.  Drug prices often vary depending on where the prescription is filled and some pharmacies may offer cheaper prices.  The website www.goodrx.com contains coupons for medications through different pharmacies. The prices here do not account for what the cost may be with help from insurance (it may be cheaper with your insurance), but the website can give you the price if you did not use any insurance.  - You can print the associated coupon and take it  with your prescription to the pharmacy.  - You may also stop by our office during regular business hours and pick up a GoodRx coupon card.  - If you need your prescription sent electronically to a different pharmacy, notify our office through [facility] or by phone at [phone] option 4.     Si Usted Necesita Algo Despus de Su Visita  Tambin puede enviarnos un mensaje a travs de Clinical Cytogeneticist. Por lo general respondemos a los mensajes de MyChart en el transcurso de 1 a 2 das hbiles.  Para renovar recetas, por favor pida a su farmacia que se ponga en contacto con nuestra oficina. Randi lakes de fax es [place] [phone].  Si tiene un asunto urgente cuando la clnica est cerrada y que no puede esperar hasta el siguiente da hbil, puede llamar/localizar a su doctor(a) al nmero que aparece a continuacin.   Por favor, tenga en  cuenta que aunque hacemos todo lo posible para estar disponibles para asuntos urgentes fuera del horario de [place], no estamos disponibles las 24 horas del da, los 7 [address]  [address] de la [place].   Si tiene un problema urgente y no puede comunicarse con nosotros, puede optar por buscar atencin mdica  en el consultorio de su doctor(a), en una clnica privada, en un centro de atencin urgente o en una sala de emergencias.  Si tiene engineer, drilling, por favor llame inmediatamente al 911 o vaya a la sala de emergencias.  Nmeros de bper  - Dr. Hester: [phone]  - Dra. Jackquline: [phone]  - Dr. Claudene: [phone]  - Dra. Kitts: [phone]  En caso de inclemencias del [place], por favor llame a nuestra lnea principal al [phone] para una actualizacin sobre el estado de cualquier retraso o cierre.  Consejos para la medicacin en dermatologa: Por favor, guarde las cajas en las que vienen los medicamentos de uso tpico para ayudarle a seguir las instrucciones sobre dnde y cmo usarlos. Las farmacias generalmente imprimen las instrucciones  del medicamento slo en las cajas y no directamente en los tubos del [place].   Si su medicamento es muy caro, por favor, pngase en contacto con landry rieger llamando al [phone] y presione la opcin 4 o envenos un mensaje a travs de Clinical Cytogeneticist.   No podemos decirle cul ser su copago por los medicamentos por adelantado ya que esto es diferente dependiendo de la cobertura de su seguro. Sin embargo, es posible que podamos encontrar un medicamento sustituto a audiological scientist un formulario para que el seguro cubra el medicamento que se considera necesario.   Si se requiere una autorizacin previa para que su compaa de seguros cubra su medicamento, por favor permtanos de 1 a 2 das hbiles para completar este proceso.  Los precios de los medicamentos varan con frecuencia dependiendo del environmental consultant de dnde se surte la receta y alguna farmacias pueden ofrecer precios ms baratos.  El sitio web www.goodrx.com tiene cupones para medicamentos de health and safety inspector. Los precios aqu no tienen en cuenta lo que podra costar con la ayuda del seguro (puede ser ms barato con su seguro), pero el sitio web puede darle el precio si no utiliz tourist information centre manager.  - Puede imprimir el cupn correspondiente y llevarlo con su receta a la farmacia.  - Tambin puede pasar por nuestra oficina durante el horario de atencin regular y education officer, museum una tarjeta de cupones de GoodRx.  - Si necesita que su receta se enve electrnicamente a una farmacia diferente, informe a nuestra oficina a travs de MyChart de [place] o por telfono llamando al [phone] y presione la opcin 4.

## 2024-11-21 ENCOUNTER — Ambulatory Visit (INDEPENDENT_AMBULATORY_CARE_PROVIDER_SITE_OTHER): Admitting: Vascular Surgery

## 2024-11-21 ENCOUNTER — Encounter (INDEPENDENT_AMBULATORY_CARE_PROVIDER_SITE_OTHER)

## 2025-01-19 ENCOUNTER — Ambulatory Visit: Admitting: Dermatology
